# Patient Record
Sex: Male | Born: 1960 | Race: White | Hispanic: No | Marital: Married | Smoking: Former smoker
Health system: Southern US, Community
[De-identification: ages and names within clinical notes are randomized; demographics above are authoritative.]

## PROBLEM LIST (undated history)

## (undated) DIAGNOSIS — I10 Essential (primary) hypertension: Secondary | ICD-10-CM

## (undated) DIAGNOSIS — R55 Syncope and collapse: Secondary | ICD-10-CM

## (undated) DIAGNOSIS — N189 Chronic kidney disease, unspecified: Secondary | ICD-10-CM

## (undated) DIAGNOSIS — E785 Hyperlipidemia, unspecified: Secondary | ICD-10-CM

## (undated) DIAGNOSIS — L723 Sebaceous cyst: Secondary | ICD-10-CM

## (undated) DIAGNOSIS — F419 Anxiety disorder, unspecified: Secondary | ICD-10-CM

## (undated) HISTORY — DX: Sebaceous cyst: L72.3

## (undated) HISTORY — DX: Anxiety disorder, unspecified: F41.9

## (undated) HISTORY — DX: Chronic kidney disease, unspecified: N18.9

## (undated) HISTORY — DX: Syncope and collapse: R55

## (undated) HISTORY — DX: Essential (primary) hypertension: I10

## (undated) HISTORY — PX: NO PAST SURGERIES: SHX2092

## (undated) HISTORY — DX: Hyperlipidemia, unspecified: E78.5

---

## 2005-01-01 ENCOUNTER — Ambulatory Visit: Payer: Self-pay | Admitting: Internal Medicine

## 2005-01-23 ENCOUNTER — Ambulatory Visit: Payer: Self-pay | Admitting: Internal Medicine

## 2006-05-26 ENCOUNTER — Ambulatory Visit: Payer: Self-pay | Admitting: Internal Medicine

## 2006-05-27 ENCOUNTER — Ambulatory Visit: Payer: Self-pay | Admitting: Internal Medicine

## 2006-06-03 ENCOUNTER — Ambulatory Visit: Payer: Self-pay | Admitting: Cardiology

## 2007-05-26 IMAGING — CT CT ABDOMEN W/O CM
2 of 4 series · 17 of 46 positions shown, 19 images · IV contrast (agent unspecified)
Comparison: None.

CLINICAL DATA: Right flank pain, hematuria.
 ABDOMEN CT WITHOUT CONTRAST:
TECHNIQUE: Multidetector CT imaging of the abdomen was performed following the standard protocol without IV contrast.
TECHNIQUE: Multidetector CT imaging of the pelvis was performed following the standard protocol without IV contrast.

[Series 2: abd_pel_w/o 5.0 b30f st · axial · 0.66mm/px · z∈[-424,-10]mm · 14 of 91 slices shown, 16 images]
[im 4/91  soft-tissue]
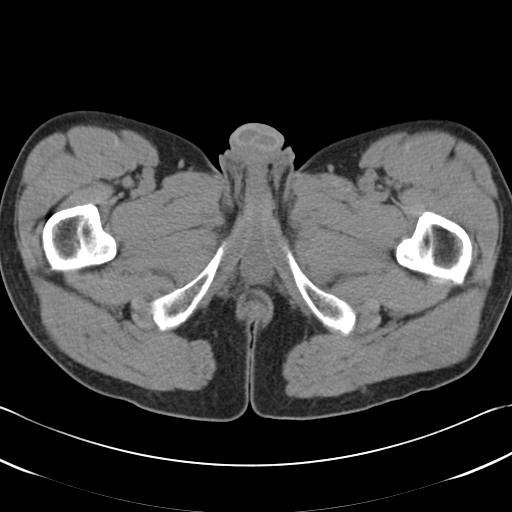
[im 4/91  bone]
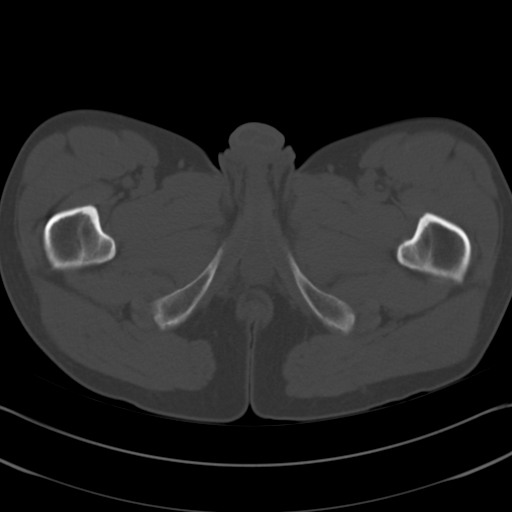
[im 12/91  soft-tissue]
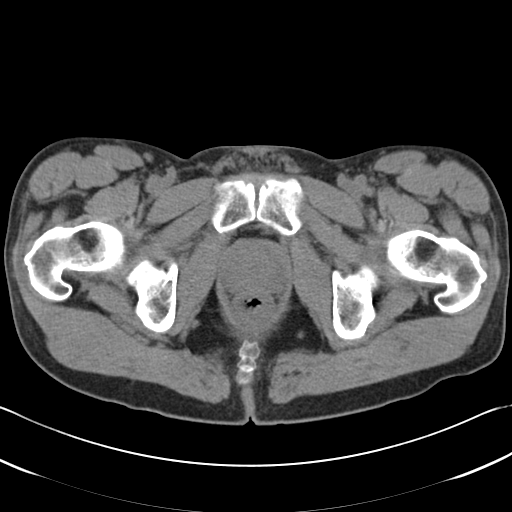
[im 19/91  soft-tissue]
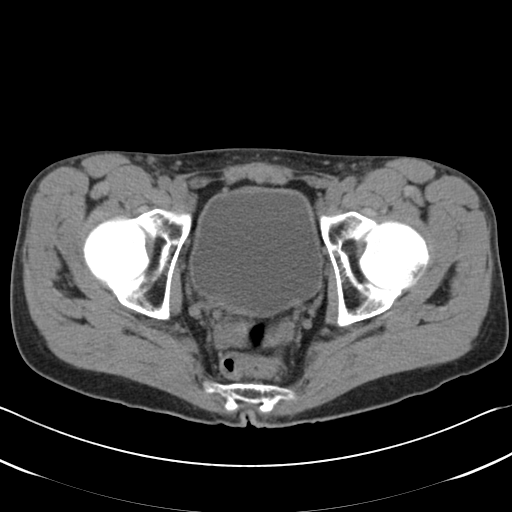
[im 23/91  soft-tissue]
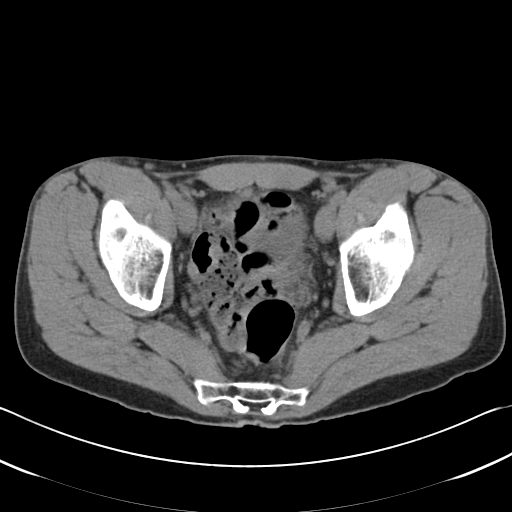
[im 31/91  soft-tissue]
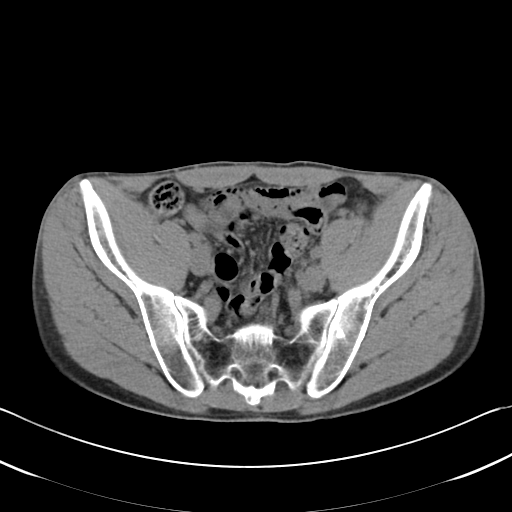
[im 38/91  soft-tissue]
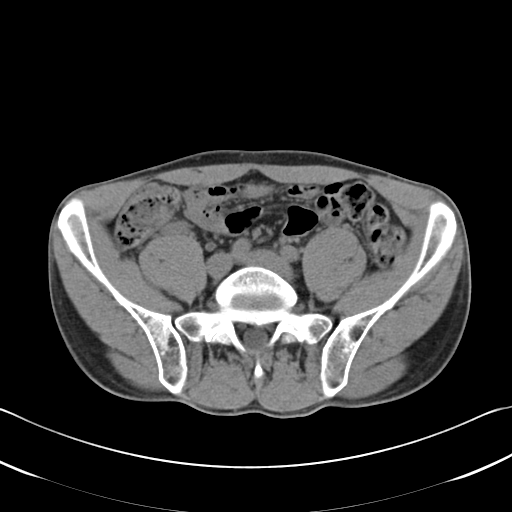
[im 42/91  soft-tissue]
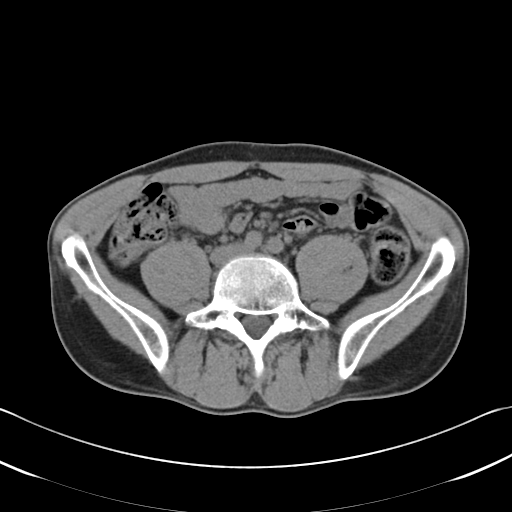
[im 49/91  soft-tissue]
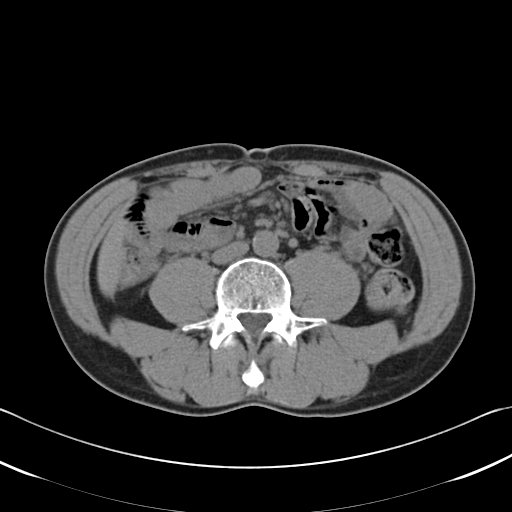
[im 53/91  soft-tissue]
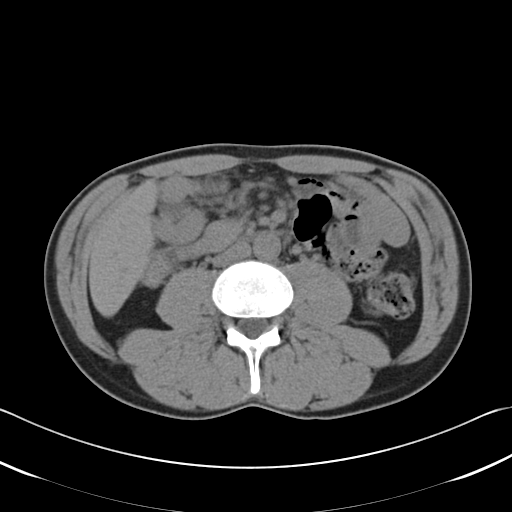
[im 53/91  bone]
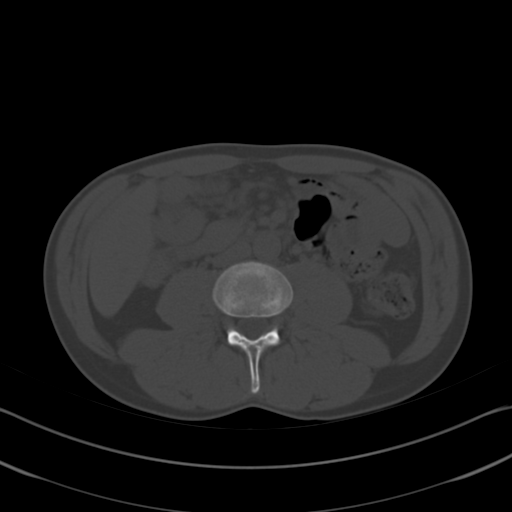
[im 61/91  soft-tissue]
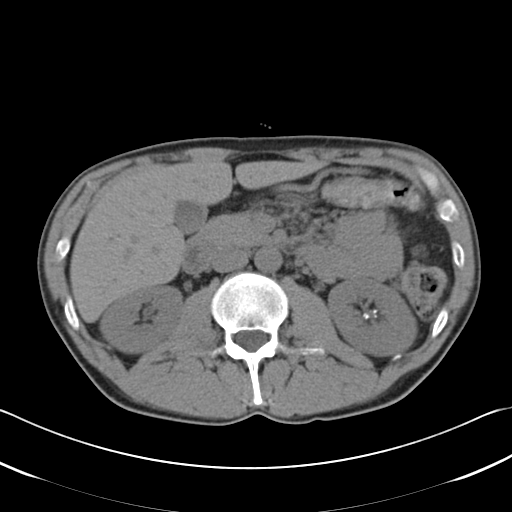
[im 68/91  soft-tissue]
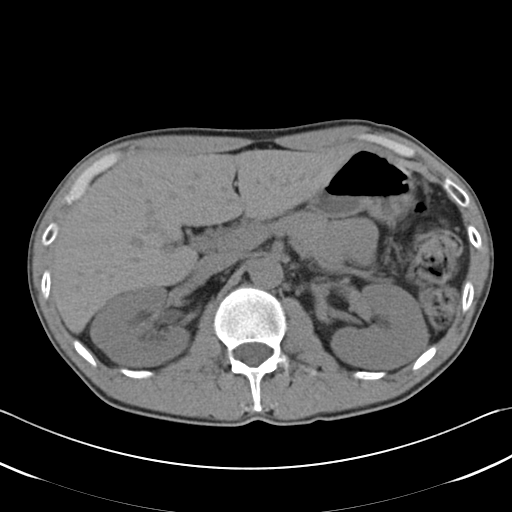
[im 72/91  soft-tissue]
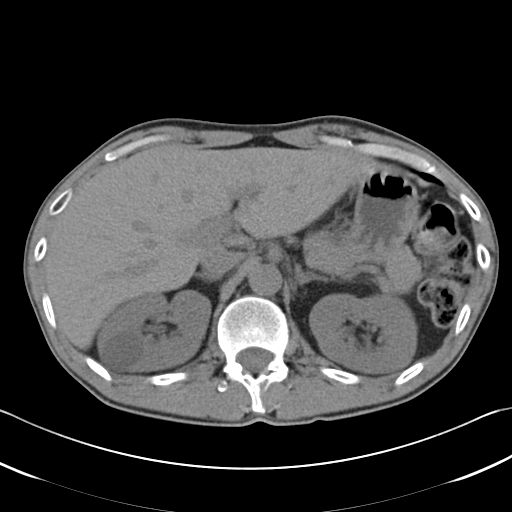
[im 79/91  soft-tissue]
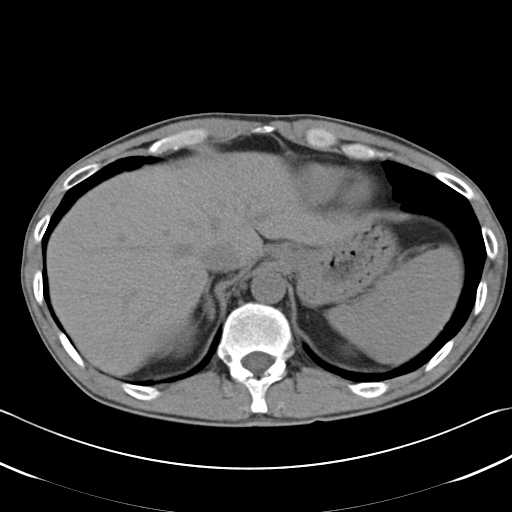
[im 87/91  soft-tissue]
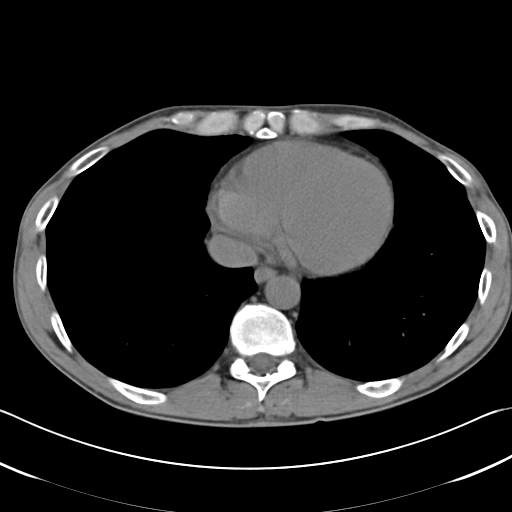

[Series 602: <mpr range> · coronal · 0.92mm/px · 3 of 63 slices shown]
[im 21/63  soft-tissue]
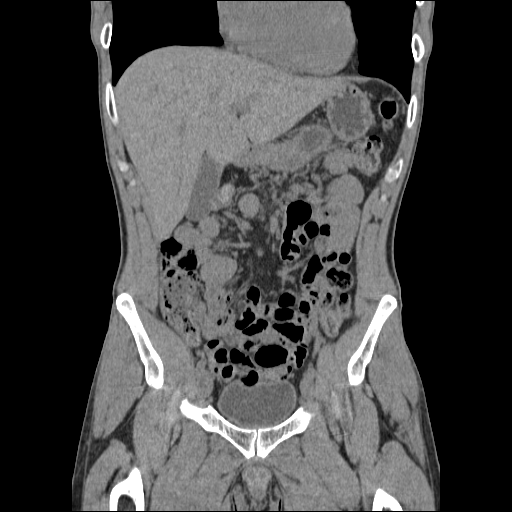
[im 28/63  soft-tissue]
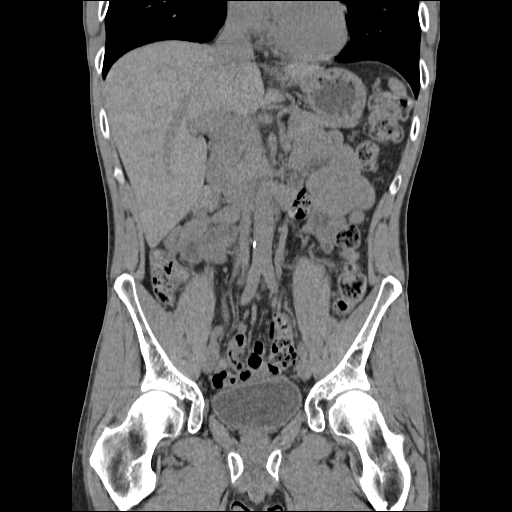
[im 35/63  soft-tissue]
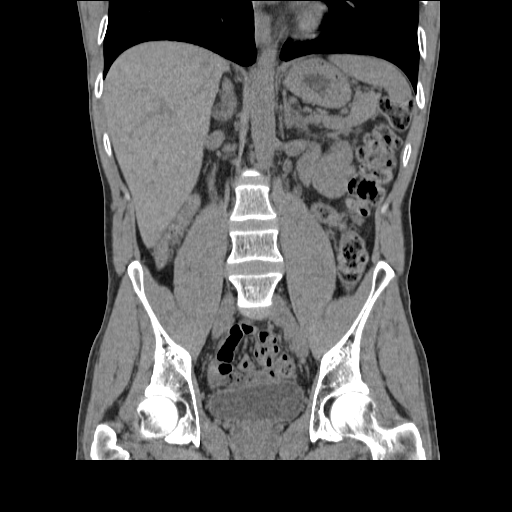

[17 of 46 positions shown; findings below may reference images not displayed]

FINDINGS: Liver measures 19 cm.  Gallbladder, adrenal glands unremarkable.  A 3 cm low attenuation lesion in the right kidney is likely a cyst.  Right ureter is minimally dilated.  There are small nonobstructing stones in the lower pole of the left kidney.  Spleen, pancreas, stomach, and small bowel are unremarkable.  There are small mesenteric and retroperitoneal lymph nodes.
IMPRESSION: 1.  Mild right hydronephrosis.  
 2.  Hepatomegaly. 
 PELVIS CT WITHOUT CONTRAST:
FINDINGS: A 4 mm stone is seen at the right ureterovesical junction.  Distal right ureter is mildly dilated.  Colon unremarkable.  No pathologically enlarged lymph nodes.  No free fluid.  No worrisome lytic or sclerotic lesion.
IMPRESSION: 4 mm right UVJ stone with mild dilatation of the right ureter.

## 2007-05-28 ENCOUNTER — Telehealth: Payer: Self-pay | Admitting: Internal Medicine

## 2007-05-30 DIAGNOSIS — R55 Syncope and collapse: Secondary | ICD-10-CM

## 2007-05-30 HISTORY — DX: Syncope and collapse: R55

## 2007-06-01 ENCOUNTER — Ambulatory Visit: Payer: Self-pay | Admitting: Internal Medicine

## 2007-06-01 DIAGNOSIS — R55 Syncope and collapse: Secondary | ICD-10-CM | POA: Insufficient documentation

## 2007-06-01 LAB — CONVERTED CEMR LAB: Glucose, Bld: 92 mg/dL

## 2007-06-07 ENCOUNTER — Telehealth: Payer: Self-pay | Admitting: Internal Medicine

## 2007-06-08 LAB — CONVERTED CEMR LAB
Alkaline Phosphatase: 46 units/L (ref 39–117)
BUN: 7 mg/dL (ref 6–23)
Basophils Relative: 1.3 % — ABNORMAL HIGH (ref 0.0–1.0)
Bilirubin, Direct: 0.1 mg/dL (ref 0.0–0.3)
CO2: 31 meq/L (ref 19–32)
Eosinophils Absolute: 0.2 10*3/uL (ref 0.0–0.6)
GFR calc Af Amer: 118 mL/min
GFR calc non Af Amer: 97 mL/min
Hemoglobin: 14.1 g/dL (ref 13.0–17.0)
Lymphocytes Relative: 43.7 % (ref 12.0–46.0)
MCHC: 33.8 g/dL (ref 30.0–36.0)
MCV: 99.4 fL (ref 78.0–100.0)
Monocytes Absolute: 0.6 10*3/uL (ref 0.2–0.7)
Monocytes Relative: 10.8 % (ref 3.0–11.0)
Neutro Abs: 2.3 10*3/uL (ref 1.4–7.7)
Potassium: 4.2 meq/L (ref 3.5–5.1)
Total Protein: 6.9 g/dL (ref 6.0–8.3)

## 2009-08-10 ENCOUNTER — Ambulatory Visit: Payer: Self-pay | Admitting: Internal Medicine

## 2009-08-10 DIAGNOSIS — L723 Sebaceous cyst: Secondary | ICD-10-CM

## 2009-08-14 LAB — CONVERTED CEMR LAB
AST: 12 units/L (ref 0–37)
Albumin: 4.9 g/dL (ref 3.5–5.2)
CO2: 25 meq/L (ref 19–32)
Chloride: 103 meq/L (ref 96–112)
HDL: 65 mg/dL (ref >39)
LDL Cholesterol: 122 mg/dL — ABNORMAL HIGH (ref 0–99)
Lymphs Abs: 2.3 10*3/uL (ref 0.7–4.0)
MCV: 91.4 fL (ref 78.0–100.0)
Monocytes Relative: 11 % (ref 3–12)
Neutro Abs: 2.3 10*3/uL (ref 1.7–7.7)
Neutrophils Relative %: 43 % (ref 43–77)
RBC: 4.51 M/uL (ref 4.22–5.81)
Sodium: 140 meq/L (ref 135–145)
TSH: 0.965 microintl units/mL (ref 0.350–4.500)
Total Bilirubin: 0.8 mg/dL (ref 0.3–1.2)
Total CHOL/HDL Ratio: 3.2
WBC: 5.4 10*3/uL (ref 4.0–10.5)

## 2010-01-28 ENCOUNTER — Ambulatory Visit: Payer: Self-pay | Admitting: Internal Medicine

## 2010-01-28 DIAGNOSIS — J069 Acute upper respiratory infection, unspecified: Secondary | ICD-10-CM | POA: Insufficient documentation

## 2010-02-04 ENCOUNTER — Telehealth: Payer: Self-pay | Admitting: Internal Medicine

## 2010-04-30 NOTE — Assessment & Plan Note (Signed)
Summary: congested/cbs   Vital Signs:  Patient profile:   50 year old male Weight:      169.38 pounds O2 Sat:      98 % on Room air Temp:     98.1 degrees F oral Pulse rate:   65 / minute Pulse rhythm:   regular BP sitting:   134 / 84  (left arm) Cuff size:   large  Vitals Entered By: Army Fossa CMA (January 28, 2010 2:28 PM)  O2 Flow:  Room air CC: Pt here c/o head congstion Comments Sinus pressure x a few weeks (L) ear bothering him pharm- [facility] [address]   History of Present Illness: sinus congestion for 2 weeks, some postnasal dripping, has been taking over-the-counter decongestants without much help and using a neti pot   Current Medications (verified): 1)  None  Allergies (verified): No Known Drug Allergies  Past History:  Past Medical History: Reviewed history from 08/10/2009 and no changes required. urolithiasis, CT 3-08.... 4 mm right UVJ stone with mild dilatation of the right ureter  syncope 3-09: refused w/u , evaluation; no further episodes   Past Surgical History: Reviewed history from 06/01/2007 and no changes required. no  Social History: Reviewed history from 08/10/2009 and no changes required. 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 Eyes:  slightly itchy eyes, no itchy nose or. ENT:  some PN drip. Resp:  mild cough no chest congestion. GI:  Denies nausea and vomiting. MS:  Denies muscle aches.  Physical Exam  General:  alert, well-developed, and well-nourished.   Head:  face symmetric, nontender to palpation Ears:  R ear normal and L ear normal.   Nose:  slightly congested, left turbinates enlarged Mouth:  no redness or discharge Lungs:  normal respiratory effort, no intercostal retractions, no accessory muscle use, and normal breath sounds.     Impression & Recommendations:  Problem #  1:  URI (ICD-465.9) URI with early sinusitis? see instructions   Complete Medication List: 1)  Amoxicillin 500 Mg Tabs (Amoxicillin) .... 2 by mouth two times a day 2)  Prednisone 10 Mg Tabs (Prednisone) .... 3 by mouth once daily x 2, 2x2,1x2  Patient Instructions: 1)  rest--fluids 2)  robitussin 2 tsp every 8 hours x 3 or 4 days 3)  amoxicillin x 1 week 4)  prednisone as prescribed 5)  call if no better in few days  6)  netti  pot as needed  Prescriptions: PREDNISONE 10 MG TABS (PREDNISONE) 3 by mouth once daily x 2, 2x2,1x2  #12 x 0   Entered and Authorized by:   Elita Quick E. Vihana Kydd MD   Signed by:   Nolon Rod. Darivs Lunden MD on 01/28/2010   Method used:   Print then Give to Patient   RxID:   [id number] AMOXICILLIN 500 MG TABS (AMOXICILLIN) 2 by mouth two times a day  #28 x 0   Entered and Authorized by:   Nolon Rod. Durant Scibilia MD   Signed by:   Nolon Rod. Amonie Wisser MD on 01/28/2010   Method used:   Print then Give to Patient   RxID:   [id number]    Orders Added: 1)  Est. Patient Level III [64403]

## 2010-04-30 NOTE — Progress Notes (Signed)
Summary: not feeling any better  Phone Note Call from Patient Call back at Work Phone [phone]   Caller: Patient Summary of Call: Dr Drue Novel saw patient last Monday 10/31--says he has finished all his meds, but he doesnt feel like symptoms have improved at all---does he need another appointment or more medication??  patient used [facility]---[address] Initial call taken by: Jerolyn Shin,  February 04, 2010 9:46 AM  Follow-up for Phone Call        Please advise. Lucious Groves CMA  February 04, 2010 10:54 AM   Additional Follow-up for Phone Call Additional follow up Details #1::        call  5 additional days of amoxicillin 500 mg 2 p.o. b.i.d.. Call if not improved in few  days Additional Follow-up by: [facility] E. Paz MD,  February 04, 2010 1:34 PM    Additional Follow-up for Phone Call Additional follow up Details #2::    Patient notified. Follow-up by: Lucious Groves CMA,  February 04, 2010 2:07 PM  Prescriptions: AMOXICILLIN 500 MG TABS (AMOXICILLIN) 2 by mouth two times a day  #10 x 0   Entered by:   Lucious Groves CMA   Authorized by:   Nolon Rod. Paz MD   Signed by:   Lucious Groves CMA on 02/04/2010   Method used:   Electronically to        [id number] (retail)       [address]       [place], [place]  [address]       Ph: [phone]       Fax: [phone]   RxID:   [id number]

## 2010-04-30 NOTE — Assessment & Plan Note (Signed)
Summary: CPX/KDC   Vital Signs:  Patient profile:   50 year old male Height:      71 inches Weight:      165.2 pounds BMI:     23.12 Pulse rate:   60 / minute BP sitting:   124 / 70  Vitals Entered By: Shary Decamp (Aug 10, 2009 2:49 PM) CC: cpx - fasting, Back Pain Is Patient Diabetic? No   History of Present Illness: CPX feels well    Preventive Screening-Counseling & Management  Alcohol-Tobacco     Alcohol type: beer, drinks 2x/week, 12/pk     Smoking Status: quit     Year Quit: 2009  Caffeine-Diet-Exercise     Caffeine use/day: 4     Does Patient Exercise: no      Drug Use:  no.    Current Medications (verified): 1)  None  Allergies (verified): No Known Drug Allergies  Past History:  Past Medical History: urolithiasis, CT 3-08.... 4 mm right UVJ stone with mild dilatation of the right ureter  syncope 3-09: refused w/u , evaluation; no further episodes   Past Surgical History: Reviewed history from 06/01/2007 and no changes required. no  Family History: Reviewed history from 06/01/2007 and no changes required. MI--father at mid 9s DM--no colon ca--no prostate ca--no  Social History: Reviewed history from 06/01/2007 and no changes required. 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 CV:  Denies chest pain or discomfort, palpitations, and swelling of feet. Resp:  Denies cough and shortness of breath. GI:  Denies bloody stools, nausea, and vomiting. GU:  Denies dysuria and hematuria; no back pain. Psych:  Denies anxiety and depression; sleep is irregular all his life .  Physical Exam  General:  alert, well-developed, and well-nourished.   Neck:  no masses, no thyromegaly, and normal carotid upstroke.    Lungs:  normal respiratory effort, no intercostal retractions, no accessory muscle use, and normal breath sounds.   Heart:  normal rate, regular rhythm, and no murmur.   Abdomen:  soft, non-tender, no distention, no masses, no guarding, and no rigidity.   Extremities:  no pretibial edema bilaterally  Neurologic:  alert & oriented X3, strength normal in all extremities, and gait normal.   Psych:  Oriented X3, memory intact for recent and remote, normally interactive, good eye contact, not anxious appearing, and not depressed appearing.     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    Preventive Care Screening  Last Tetanus Booster:    Date:  03/31/2004    Results:  Td  Prior Values:    PSA:  0.62 (05/27/2006)  Risk Factors:  Tobacco use:  quit    Year quit:  2009 Drug use:  no Caffeine use:  4 drinks per day Alcohol use:  yes    Type:  beer, drinks 2x/week, 12/pk Exercise:  no

## 2011-03-17 ENCOUNTER — Encounter: Payer: Self-pay | Admitting: *Deleted

## 2011-03-17 ENCOUNTER — Emergency Department (HOSPITAL_BASED_OUTPATIENT_CLINIC_OR_DEPARTMENT_OTHER)
Admission: EM | Admit: 2011-03-17 | Discharge: 2011-03-17 | Disposition: A | Discharge status: Home / Self Care | Payer: PRIVATE HEALTH INSURANCE | Attending: Emergency Medicine | Admitting: Emergency Medicine

## 2011-03-17 ENCOUNTER — Emergency Department (INDEPENDENT_AMBULATORY_CARE_PROVIDER_SITE_OTHER): Payer: PRIVATE HEALTH INSURANCE

## 2011-03-17 ENCOUNTER — Other Ambulatory Visit: Payer: Self-pay

## 2011-03-17 DIAGNOSIS — R0789 Other chest pain: Secondary | ICD-10-CM

## 2011-03-17 DIAGNOSIS — R079 Chest pain, unspecified: Secondary | ICD-10-CM | POA: Insufficient documentation

## 2011-03-17 LAB — BASIC METABOLIC PANEL
BUN: 8 mg/dL (ref 6–23)
Creatinine, Ser: 0.7 mg/dL (ref 0.50–1.35)
GFR calc non Af Amer: >90 mL/min (ref >90)
Glucose, Bld: 90 mg/dL (ref 70–99)
Potassium: 3.8 mEq/L (ref 3.5–5.1)

## 2011-03-17 LAB — CBC
HCT: 41 % (ref 39.0–52.0)
Hemoglobin: 14.6 g/dL (ref 13.0–17.0)
MCHC: 35.6 g/dL (ref 30.0–36.0)
MCV: 91.7 fL (ref 78.0–100.0)

## 2011-03-17 LAB — CARDIAC PANEL(CRET KIN+CKTOT+MB+TROPI)
Relative Index: INVALID (ref 0.0–2.5)
Troponin I: <0.3 ng/mL (ref <0.30)

## 2011-03-17 MED ORDER — ASPIRIN 81 MG PO CHEW
324.0000 mg | CHEWABLE_TABLET | Freq: Once | ORAL | Status: AC
  Administered 2011-03-17: 324 mg via ORAL
  Filled 2011-03-17: qty 4

## 2011-03-17 NOTE — ED Notes (Signed)
Call placed to [facility] Cardiologist via carelink

## 2011-03-17 NOTE — ED Provider Notes (Signed)
Medical screening examination/treatment/procedure(s) were conducted as a shared visit with non-physician practitioner(s) and myself.  I personally evaluated the patient during the encounter  Concerning for unstable angina with strong family hx and intermittent left shoulder "tightness" that is worsening to the point that it brought him to the ER today. I spoke with Dr Dietrich Pates, cardiology who agreed to admission. However, after this was completed, the pt decided he would prefer to complete his evaluation as an outpatient. He agrees to leave AMA. He understands the risks which include death and these were laid out to the patient in plain terms with his wife present. Advised to return for worsening symptoms or if he changes his mind. I personally called and obtained the earliest appointment without [facility]. He is educated and of sound mind and has the capacity to make his own decisions. His wife was present for all of this  1. Chest pain    Dg Chest 2 View  03/17/2011  *RADIOLOGY REPORT*  Clinical Data: Chest pain  CHEST - 2 VIEW  Comparison: None.  Findings: Cardiomediastinal silhouette is unremarkable.  No acute infiltrate or pleural effusion.  No pulmonary edema.  Bony thorax is unremarkable.  IMPRESSION: No active disease.  Original Report Authenticated By: Natasha Mead, M.D.   I personally evaluated the patients cxr  Results for orders placed during the hospital encounter of 03/17/11  CBC      Component Value Range   WBC 5.7  4.0 - 10.5 (K/uL)   RBC 4.47  4.22 - 5.81 (MIL/uL)   Hemoglobin 14.6  13.0 - 17.0 (g/dL)   HCT 60.4  54.0 - 98.1 (%)   MCV 91.7  78.0 - 100.0 (fL)   MCH 32.7  26.0 - 34.0 (pg)   MCHC 35.6  30.0 - 36.0 (g/dL)   RDW 19.1  47.8 - 29.5 (%)   Platelets 250  150 - 400 (K/uL)  BASIC METABOLIC PANEL      Component Value Range   Sodium 138  135 - 145 (mEq/L)   Potassium 3.8  3.5 - 5.1 (mEq/L)   Chloride 101  96 - 112 (mEq/L)   CO2 25  19 - 32 (mEq/L)   Glucose,  Bld 90  70 - 99 (mg/dL)   BUN 8  6 - 23 (mg/dL)   Creatinine, Ser 6.21  0.50 - 1.35 (mg/dL)   Calcium 9.4  8.4 - 30.8 (mg/dL)   GFR calc non Af Amer >90  >90 (mL/min)   GFR calc Af Amer >90  >90 (mL/min)  CARDIAC PANEL(CRET KIN+CKTOT+MB+TROPI)      Component Value Range   Total CK 81  7 - 232 (U/L)   CK, MB 1.7  0.3 - 4.0 (ng/mL)   Troponin I <0.30  <0.30 (ng/mL)   Relative Index RELATIVE INDEX IS INVALID  0.0 - 2.5   TROPONIN I      Component Value Range   Troponin I <0.30  <0.30 (ng/mL)     Lyanne Co, MD 03/17/11 2217

## 2011-03-17 NOTE — ED Provider Notes (Signed)
History     CSN: [id number] Arrival date & time: 03/17/2011 11:45 AM   First MD Initiated Contact with Patient 03/17/11 1145      No chief complaint on file.   (Consider location/radiation/quality/duration/timing/severity/associated sxs/prior treatment) HPI Comments: Patients with onset of left lateral chest "tightness" and a numb sensation in his left hand that started 2 hours prior to arrival. Patient states that he has been having several episodes of the past 2-3 weeks of similar symptoms however it was worse today. Tightness is unassociated with activity or time of day. It is not worsened with movement or palpation. The patient denies new injury. The tightness does not radiate. It is not associated with diaphoresis, palpitations, nausea or vomiting, lightheadedness. The patient denies any shortness of breath, lower extremity edema, recent immobilization, past history of blood clots. The patient is a former smoker and quit 3 years ago. He denies a history of hyperlipidemia, hypertension, or diabetes. His family history is significant for his father who had a heart attack at age 77. No treatments or aspirin prior to arrival.  Patient is a 50 y.o. male presenting with chest pain. The history is provided by the patient.  Chest Pain The chest pain began 1 - 2 hours ago. Duration of episode(s) is 2 hours. Chest pain occurs constantly. The chest pain is unchanged. The quality of the pain is described as tightness. The pain does not radiate. Pertinent negatives for primary symptoms include no fever, no shortness of breath, no cough, no wheezing, no palpitations, no abdominal pain, no nausea and no vomiting.  Pertinent negatives for associated symptoms include no lower extremity edema and no weakness. He tried nothing for the symptoms. Risk factors include smoking/tobacco exposure.  Pertinent negatives for past medical history include no CAD, no diabetes, no hyperlipidemia and no hypertension.  His  family medical history is significant for early MI in family.  Procedure history is negative for cardiac catheterization.     No past medical history on file.  No past surgical history on file.  No family history on file.  History  Substance Use Topics  . Smoking status: Not on file  . Smokeless tobacco: Not on file  . Alcohol Use: Not on file      Review of Systems  Constitutional: Negative for fever.  HENT: Negative for neck pain.   Eyes: Negative for redness.  Respiratory: Negative for cough, shortness of breath and wheezing.   Cardiovascular: Positive for chest pain. Negative for palpitations.  Gastrointestinal: Negative for nausea, vomiting and abdominal pain.  Genitourinary: Negative for dysuria.  Musculoskeletal: Negative for back pain.  Skin: Negative for rash.  Neurological: Negative for syncope, weakness and light-headedness.    Allergies  Review of patient's allergies indicates not on file.  Home Medications  No current outpatient prescriptions on file.  BP 155/101  Pulse 102  Temp(Src) 98.1 F (36.7 C) (Oral)  Resp 18  Ht 5\' 11"  (1.803 m)  Wt 150 lb (68.04 kg)  BMI 20.92 kg/m2  SpO2 100%  Physical Exam  Nursing note and vitals reviewed. Constitutional: He is oriented to person, place, and time. He appears well-developed and well-nourished.  HENT:  Head: Normocephalic and atraumatic.  Eyes: Conjunctivae are normal. Right eye exhibits no discharge. Left eye exhibits no discharge.  Neck: Normal range of motion. Neck supple.  Cardiovascular: Normal rate, regular rhythm, normal heart sounds and intact distal pulses.   No murmur heard. Pulmonary/Chest: Effort normal and breath sounds normal. No respiratory distress.  He has no wheezes. He exhibits no tenderness.  Abdominal: Soft. Bowel sounds are normal. There is no tenderness. There is no rebound and no guarding.  Musculoskeletal: He exhibits no edema and no tenderness.       No shoulder pain or  chest pain with palpation or movement. Patient has full range of motion and 5 out of 5 strength in left upper extremity.  Neurological: He is alert and oriented to person, place, and time.  Skin: Skin is warm and dry.  Psychiatric: He has a normal mood and affect.    ED Course  Procedures (including critical care time)   Labs Reviewed  CBC  BASIC METABOLIC PANEL  CARDIAC PANEL(CRET KIN+CKTOT+MB+TROPI)  TROPONIN I   Dg Chest 2 View  03/17/2011  *RADIOLOGY REPORT*  Clinical Data: Chest pain  CHEST - 2 VIEW  Comparison: None.  Findings: Cardiomediastinal silhouette is unremarkable.  No acute infiltrate or pleural effusion.  No pulmonary edema.  Bony thorax is unremarkable.  IMPRESSION: No active disease.  Original Report Authenticated By: Natasha Mead, M.D.     1. Chest pain     12:00 PM Patient seen and examined. Aspirin ordered. Workup initiated. EKG reviewed and is normal.   Date: 03/17/2011  Rate: 103  Rhythm: sinus tachycardia  QRS Axis: normal  Intervals: normal  ST/T Wave abnormalities: normal  Conduction Disutrbances:none  Narrative Interpretation:   Old EKG Reviewed: none available  2:08 PM Dr. Patria Mane has seen patient. Will admit to [facility] for chest pain workup given risk factors.  2:42 PM patient wishes to leave AMA. Appointment scheduled with Dr. Eden Emms on 04/08/2011 at 1:45pm. Pt informed.      MDM  Admitted to [facility] for chest pain.  2:42 PM patient to sign out AGAINST MEDICAL ADVICE.    Carolee Rota, PA 03/17/11 1409  Carolee Rota, [place] 03/19/11 [phone]

## 2011-03-17 NOTE — ED Notes (Signed)
Call place to [facility] cardiologist via carelink for Dr. Patria Mane

## 2011-03-17 NOTE — ED Notes (Signed)
Pt to room 4 in w/c, able to stand and take steps to bed. Pt is a/a/ox4 in nad, pt reports sudden onset of left chest/shoulder pain, while working at his computer, with left arm numbness. Pt states all tightness and numbness have resolved.

## 2011-03-17 NOTE — ED Notes (Signed)
Repeat ECG done per VO from [facility] and given to him for confirmation. Patient has no additional needs at this time

## 2011-03-18 ENCOUNTER — Ambulatory Visit (INDEPENDENT_AMBULATORY_CARE_PROVIDER_SITE_OTHER): Payer: PRIVATE HEALTH INSURANCE | Admitting: Internal Medicine

## 2011-03-18 VITALS — BP 118/72 | HR 83 | Temp 98.1°F | Ht 71.0 in | Wt 166.0 lb

## 2011-03-18 DIAGNOSIS — R079 Chest pain, unspecified: Secondary | ICD-10-CM | POA: Insufficient documentation

## 2011-03-18 DIAGNOSIS — F411 Generalized anxiety disorder: Secondary | ICD-10-CM

## 2011-03-18 DIAGNOSIS — F419 Anxiety disorder, unspecified: Secondary | ICD-10-CM

## 2011-03-18 LAB — AST: AST: 17 U/L (ref 0–37)

## 2011-03-18 LAB — ALT: ALT: 15 U/L (ref 0–53)

## 2011-03-18 LAB — LIPID PANEL: HDL: 78.5 mg/dL (ref >39.00)

## 2011-03-18 MED ORDER — CITALOPRAM HYDROBROMIDE 20 MG PO TABS: 20.0000 mg | ORAL_TABLET | Freq: Every day | ORAL | Status: DC

## 2011-03-18 NOTE — Assessment & Plan Note (Signed)
Long h/o anxiety, worse lately Treatment options discussed: counseling, SSRI, benzos In the past he used zoloft, does not recall how he did. We agreed to try SSRIs, given insomnia, citalopram at bedtime is a good choice Plan; Citalopram RTC 1 month for CPX

## 2011-03-18 NOTE — Progress Notes (Signed)
  Subjective:    Patient ID: Matthew Bautista, male    DOB: December 19, 1960, 50 y.o.   MRN: [id number]  HPI Acute visit, here for the management of anxiety. Reports a lot of stress in his life, related mostly to having a new house and family members visiting him this Christmas. In  the past he did have anxiety, at some point he took Zoloft, this is however the first time sx are severe. Yesterday, he got acutely anxious, developed a "pulling in the chest" and left arm numbness. States this is not the first time he has such symptoms and they are usually related to anxiety. Records from the ER reviewed, EKG normal, BMP, CKs x2, CBC normal. Chest x-ray normal. He left AMA, decided not to be admitted to cardiology.  Past Medical History: urolithiasis, CT 3-08.... 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 He is asleep is not the best but is not worse than usual. Since he left the ER, no further chest symptoms    Objective:   Physical Exam  Constitutional: He is oriented to person, place, and time. He appears well-developed and well-nourished.  HENT:  Head: Normocephalic and atraumatic.  Cardiovascular: Normal rate, regular rhythm and normal heart sounds.   No murmur heard. Pulmonary/Chest: Effort normal and breath sounds normal. No respiratory distress. He has no wheezes. He has no rales.  Musculoskeletal: He exhibits no edema.  Neurological: He is alert and oriented to person, place, and time.  Psychiatric: He has a normal mood and affect. His behavior is  normal. Judgment and thought content normal.       Assessment & Plan:

## 2011-03-18 NOTE — Assessment & Plan Note (Addendum)
S/p evaluation for CP @ ER yesterday, left AMA, declined admission He already has an outpt evaluation schedule w/  Cards rec  ASA ER if sx resurface  Check a cholesterol panel

## 2011-03-18 NOTE — Patient Instructions (Signed)
Citalopram 20 mg : 1/2 tablet a day x 1 week, then 1 tablet a day. Take it in the afternoon

## 2011-03-19 ENCOUNTER — Encounter: Payer: Self-pay | Admitting: Internal Medicine

## 2011-03-19 NOTE — ED Provider Notes (Signed)
Medical screening examination/treatment/procedure(s) were conducted as a shared visit with non-physician practitioner(s) and myself.  I personally evaluated the patient during the encounter   Lyanne Co, MD 03/19/11 1705

## 2011-03-26 ENCOUNTER — Other Ambulatory Visit: Payer: Self-pay | Admitting: *Deleted

## 2011-03-26 ENCOUNTER — Encounter: Payer: Self-pay | Admitting: *Deleted

## 2011-03-26 MED ORDER — ATORVASTATIN CALCIUM 10 MG PO TABS: 10.0000 mg | ORAL_TABLET | Freq: Every day | ORAL | Status: DC

## 2011-04-04 ENCOUNTER — Encounter: Payer: Self-pay | Admitting: *Deleted

## 2011-04-08 ENCOUNTER — Encounter: Payer: Self-pay | Admitting: Cardiovascular Disease

## 2011-04-08 ENCOUNTER — Ambulatory Visit (INDEPENDENT_AMBULATORY_CARE_PROVIDER_SITE_OTHER): Payer: PRIVATE HEALTH INSURANCE | Admitting: Cardiovascular Disease

## 2011-04-08 DIAGNOSIS — E782 Mixed hyperlipidemia: Secondary | ICD-10-CM

## 2011-04-08 DIAGNOSIS — F419 Anxiety disorder, unspecified: Secondary | ICD-10-CM

## 2011-04-08 DIAGNOSIS — R079 Chest pain, unspecified: Secondary | ICD-10-CM

## 2011-04-08 DIAGNOSIS — F411 Generalized anxiety disorder: Secondary | ICD-10-CM

## 2011-04-08 NOTE — Assessment & Plan Note (Signed)
Continue celexa Has been on highter dose in past  F/U [facility]

## 2011-04-08 NOTE — Progress Notes (Signed)
51 yo referred by Dr Drue Novel for SSCP.  Seen in ER in December.  R/O normal ECG.  Some stress and anxiety around the holidays.  Just moved into a new house and had family in.  Since then has been on celexa and statin.  :Pain atypical and nonexertional.  Feels like seat belt around left shoulder.  Has not felt as much since ER.  No previous shoulder,neck or back pathology.  Pain centered in shoulder and left arm.  CRF;s elevated cholesterol and family history.  No dyspnea, palpitations or syncope.  Concerned about taking statins long term.  Has never had a ETT  ROS: Denies fever, malais, weight loss, blurry vision, decreased visual acuity, cough, sputum, SOB, hemoptysis, pleuritic pain, palpitaitons, heartburn, abdominal pain, melena, lower extremity edema, claudication, or rash.  All other systems reviewed and negative   General: Affect appropriate Healthy:  appears stated age HEENT: normal Neck supple with no adenopathy JVP normal no bruits no thyromegaly Lungs clear with no wheezing and good diaphragmatic motion Heart:  S1/S2 no murmur,rub, gallop or click PMI normal Abdomen: benighn, BS positve, no tenderness, no AAA no bruit.  No HSM or HJR Distal pulses intact with no bruits No edema Neuro non-focal Skin warm and dry No muscular weakness  Medications Current Outpatient Prescriptions  Medication Sig Dispense Refill  . aspirin 81 MG tablet Take 81 mg by mouth daily.        Marland Kitchen atorvastatin (LIPITOR) 10 MG tablet Take 1 tablet (10 mg total) by mouth at bedtime.  30 tablet  3  . citalopram (CELEXA) 20 MG tablet Take 1 tablet (20 mg total) by mouth daily.  30 tablet  1    Allergies Review of patient's allergies indicates no known allergies.  Family History: No family history on file.  Social History: History   Social History  . Marital Status: Married    Spouse Name: N/A    Number of Children: N/A  . Years of Education: N/A   Occupational History  . Not on file.   Social  History Main Topics  . Smoking status: Former Games developer  . Smokeless tobacco: Not on file  . Alcohol Use: Yes  . Drug Use: No  . Sexually Active:    Other Topics Concern  . Not on file   Social History Narrative  . No narrative on file    Electrocardiogram: 12/12 NSR normal ECG in ER  Assessment and Plan

## 2011-04-08 NOTE — Assessment & Plan Note (Signed)
Cholesterol is at goal.  Continue current dose of statin and diet Rx.  No myalgias or side effects.  F/U  LFT's in 6 months. Lab Results  Component Value Date   LDLCALC 122* 08/10/2009   F/U [facility].  Consider red yeast rice and diet especially if ETT is normal

## 2011-04-08 NOTE — Assessment & Plan Note (Signed)
Atypical normal ECG  F/U ETT  

## 2011-04-08 NOTE — Patient Instructions (Signed)
Your physician recommends that you schedule a follow-up appointment in: AS NEEDED  Your physician recommends that you continue on your current medications as directed. Please refer to the Current Medication list given to you today. Your physician has requested that you have an exercise tolerance test. For further information please visit [email or web address] Please also follow instruction sheet, as given. DX CHEST PAIN  SCHEDULE WITH [facility] OR DR Eden Emms

## 2011-04-22 ENCOUNTER — Encounter: Payer: Self-pay | Admitting: *Deleted

## 2011-04-23 ENCOUNTER — Encounter: Payer: Self-pay | Admitting: Internal Medicine

## 2011-04-23 ENCOUNTER — Ambulatory Visit (INDEPENDENT_AMBULATORY_CARE_PROVIDER_SITE_OTHER): Payer: PRIVATE HEALTH INSURANCE | Admitting: Internal Medicine

## 2011-04-23 VITALS — BP 118/84 | HR 76 | Temp 98.2°F | Resp 14 | Wt 164.4 lb

## 2011-04-23 DIAGNOSIS — E782 Mixed hyperlipidemia: Secondary | ICD-10-CM

## 2011-04-23 DIAGNOSIS — F419 Anxiety disorder, unspecified: Secondary | ICD-10-CM

## 2011-04-23 DIAGNOSIS — F411 Generalized anxiety disorder: Secondary | ICD-10-CM

## 2011-04-23 LAB — LIPID PANEL
HDL: 69.1 mg/dL (ref >39.00)
Triglycerides: 75 mg/dL (ref 0.0–149.0)

## 2011-04-23 LAB — ALT: ALT: 16 U/L (ref 0–53)

## 2011-04-23 LAB — AST: AST: 16 U/L (ref 0–37)

## 2011-04-23 MED ORDER — CITALOPRAM HYDROBROMIDE 20 MG PO TABS: 20.0000 mg | ORAL_TABLET | Freq: Every day | ORAL | Status: DC

## 2011-04-23 MED ORDER — ATORVASTATIN CALCIUM 10 MG PO TABS: 10.0000 mg | ORAL_TABLET | Freq: Every day | ORAL | Status: DC

## 2011-04-23 NOTE — Assessment & Plan Note (Addendum)
Great improvement with citalopram without apparent side effects. Plan to reassess the need to continue w/ medication in 4 months when he comes back. Apparently the medicine is also helping him at work, I suspect he may have have chronic, mild, anxiety.

## 2011-04-23 NOTE — Assessment & Plan Note (Signed)
Based on the last cholesterol panel, he started statins, good compliance and tolerance. He has a family history of heart disease, LDL goal of 100. Labs

## 2011-04-23 NOTE — Progress Notes (Signed)
  Subjective:    Patient ID: Matthew Bautista, male    DOB: 03-19-1961, 51 y.o.   MRN: [id number]  HPI Followup from previous visit, he was seen with acute anxiety and started on SSRIs, good medication compliance He was also started on cholesterol medication, good compliance. No apparent side effects.  Past Medical History:  urolithiasis, CT 3-08.... 4 mm right UVJ stone with mild dilatation of the right ureter  syncope 3-09: refused w/u , evaluation; no further episodes  Anxiety  Past Surgical History:  no   Social History:  Married, 1 child  quit tobacco-- 3-09  ETOH-- socially  Drugs-- no  job sedentary Designer, jewellery -computer)  diet-- try to eat healthy  exercise-- no    Review of Systems Patient reports that his anxiety has decreased quite a bit, is doing better. States that he is even helping he not work. Denies any depression or sleep disorder. As far as the CP  he saw cardiology, to have a stress test tomorrow. No further severe symptoms but still has from time to time a "tightness or numbness" near the left shoulder. No substernal chest pain.    Objective:   Physical Exam Alert oriented x3. No anxiety or depression noted. He seems to be in good spirits       Assessment & Plan:

## 2011-04-24 ENCOUNTER — Ambulatory Visit (INDEPENDENT_AMBULATORY_CARE_PROVIDER_SITE_OTHER): Payer: PRIVATE HEALTH INSURANCE | Admitting: Cardiovascular Disease

## 2011-04-24 DIAGNOSIS — R079 Chest pain, unspecified: Secondary | ICD-10-CM

## 2011-04-24 NOTE — Progress Notes (Signed)
Patient ID: Matthew Bautista, male   DOB: Dec 12, 1960, 51 y.o.   MRN: [id number] Exercise Treadmill Test  Pre-Exercise Testing Evaluation Rhythm: normal sinus  Rate: 88   PR:  .13 QRS:  .08  QT:  .36 QTc: .43     Test  Exercise Tolerance Test Ordering MD: Charlton Haws, MD  Interpreting MD:  Charlton Haws, MD  Unique Test No: 1  Treadmill:  1  Indication for ETT: chest pain - rule out ischemia  Contraindication to ETT: No   Stress Modality: exercise - treadmill  Cardiac Imaging Performed: non   Protocol: standard Bruce - maximal  Max BP:  169/90  Max MPHR (bpm):  172 85% MPR (bpm):  146  MPHR obtained (bpm):  193 % MPHR obtained:  120  Reached 85% MPHR (min:sec):  4:20 Total Exercise Time (min-sec):  8:00  Workload in METS:  13.4 Borg Scale: 17  Reason ETT Terminated:  fatigue    ST Segment Analysis At Rest: normal ST segments - no evidence of significant ST depression With Exercise: no evidence of significant ST depression  Other Information Arrhythmia:  No Angina during ETT:  absent (0) Quality of ETT:  diagnostic  ETT Interpretation:  normal - no evidence of ischemia by ST analysis  Comments: Normal stress test at good work load. Stages advanced at two minute intervals  Recommendations: Continue risk factor modification

## 2011-04-28 ENCOUNTER — Encounter: Payer: Self-pay | Admitting: Internal Medicine

## 2011-08-20 ENCOUNTER — Ambulatory Visit: Payer: PRIVATE HEALTH INSURANCE | Admitting: Internal Medicine

## 2011-08-22 ENCOUNTER — Encounter: Payer: Self-pay | Admitting: Internal Medicine

## 2011-08-22 ENCOUNTER — Ambulatory Visit (INDEPENDENT_AMBULATORY_CARE_PROVIDER_SITE_OTHER): Payer: PRIVATE HEALTH INSURANCE | Admitting: Internal Medicine

## 2011-08-22 VITALS — BP 122/84 | HR 69 | Temp 97.7°F | Wt 164.0 lb

## 2011-08-22 DIAGNOSIS — F419 Anxiety disorder, unspecified: Secondary | ICD-10-CM

## 2011-08-22 DIAGNOSIS — E782 Mixed hyperlipidemia: Secondary | ICD-10-CM

## 2011-08-22 DIAGNOSIS — F411 Generalized anxiety disorder: Secondary | ICD-10-CM

## 2011-08-22 DIAGNOSIS — R079 Chest pain, unspecified: Secondary | ICD-10-CM

## 2011-08-22 NOTE — Progress Notes (Signed)
  Subjective:    Patient ID: Matthew Bautista, male    DOB: 24-Oct-1960, 51 y.o.   MRN: [id number]  HPI Routine office visit Here for reevaluation of anxiety, good compliance with Celexa, no apparent side effects. The patient feels great, his "disposition" has improved significantly.  Past Medical History:   urolithiasis, CT 3-08.... 4 mm right UVJ stone with mild dilatation of the right ureter   syncope 3-09: refused w/u , evaluation; no further episodes   Anxiety    Past Surgical History:   no   Social History:   Married, 1 child   quit tobacco-- 3-09   ETOH-- socially   Drugs-- no   job sedentary Designer, jewellery -computer)     Review of Systems Reports good compliance with Lipitor without apparent side effects. He has chronic insomnia, that hasn't changed much. Denies any chest pain, shortness of breath. Self discontinue aspirin do to increased bleeding with minor cuts    Objective:   Physical Exam  General -- alert, well-developed, and well-nourished.   Lungs -- normal respiratory effort, no intercostal retractions, no accessory muscle use, and normal breath sounds.   Heart-- normal rate, regular rhythm, no murmur, and no gallop.   Extremities-- no pretibial edema bilaterally Psych-- Cognition and judgment appear intact. Alert and cooperative with normal attention span and concentration.  not anxious appearing and not depressed appearing.       Assessment & Plan:

## 2011-08-22 NOTE — Assessment & Plan Note (Signed)
Good med compliance, no change, labs on RTC

## 2011-08-22 NOTE — Assessment & Plan Note (Addendum)
Stress test was neg, asx at this point

## 2011-08-22 NOTE — Assessment & Plan Note (Signed)
Doing great, good compliance with medication without side effects. Has chronic insomnia but is not bothered by it. Denies need self any medication Plan: Continue with SSRIs

## 2011-08-22 NOTE — Patient Instructions (Signed)
Please come back in 4-5 months for a physical exam, fasting. Call for refills as needed

## 2011-10-30 ENCOUNTER — Ambulatory Visit (INDEPENDENT_AMBULATORY_CARE_PROVIDER_SITE_OTHER): Payer: PRIVATE HEALTH INSURANCE | Admitting: Family Medicine

## 2011-10-30 ENCOUNTER — Telehealth: Payer: Self-pay | Admitting: Internal Medicine

## 2011-10-30 ENCOUNTER — Encounter: Payer: Self-pay | Admitting: Family Medicine

## 2011-10-30 VITALS — BP 124/80 | HR 74 | Temp 97.9°F | Ht 71.0 in | Wt 164.6 lb

## 2011-10-30 DIAGNOSIS — L03039 Cellulitis of unspecified toe: Secondary | ICD-10-CM

## 2011-10-30 DIAGNOSIS — L03032 Cellulitis of left toe: Secondary | ICD-10-CM

## 2011-10-30 MED ORDER — AMOXICILLIN-POT CLAVULANATE 875-125 MG PO TABS: 1.0000 | ORAL_TABLET | Freq: Two times a day (BID) | ORAL | Status: AC

## 2011-10-30 NOTE — Telephone Encounter (Signed)
Patient with appointment today

## 2011-10-30 NOTE — Patient Instructions (Addendum)
Please call if no improvement of worsening in the next few days Start the Augmentin twice daily- take w/ food Warm soaks in Epsom salts Call with any questions or concerns- or if you change your mind on the podiatrist Hang in there!

## 2011-10-30 NOTE — Telephone Encounter (Signed)
Caller: Gryffin/Patient; PCP: Willow Ora; CB#: [phone]. Call regarding Infected Great Toe; Caller requesting an appt for an infected great toe. Sxs began 2-3 days ago and home care has not improved sxs. No appts with PCP. Scheduled for today, Thurs 8/1 with Dr Beverely Low at 11:30. Caller is agreeable.

## 2011-10-30 NOTE — Progress Notes (Signed)
  Subjective:    Patient ID: Matthew Bautista, male    DOB: 01/08/1961, 51 y.o.   MRN: [id number]  HPI Foot infxn- sxs started 5-7 days ago.  L big toe is painful, red, draining clear fluid.  No hx of similar.  No fevers.  No spreading redness.   Review of Systems For ROS see HPI     Objective:   Physical Exam  Skin: There is erythema (pt w/ erythema, induration, and visible pus (w/out drainable fluid collection) along medial margin of L great nail.  + TTP).          Assessment & Plan:

## 2011-11-04 NOTE — Assessment & Plan Note (Signed)
New.  Start abx.  Offered podiatry referral but pt declined.  Reviewed supportive care and red flags that should prompt return.  Pt expressed understanding and is in agreement w/ plan.

## 2011-12-09 ENCOUNTER — Other Ambulatory Visit: Payer: Self-pay | Admitting: Internal Medicine

## 2011-12-09 NOTE — Telephone Encounter (Signed)
Refill done.  

## 2012-01-19 ENCOUNTER — Encounter: Payer: Self-pay | Admitting: Internal Medicine

## 2012-01-19 ENCOUNTER — Ambulatory Visit (INDEPENDENT_AMBULATORY_CARE_PROVIDER_SITE_OTHER): Payer: PRIVATE HEALTH INSURANCE | Admitting: Internal Medicine

## 2012-01-19 VITALS — BP 120/84 | HR 68 | Temp 98.0°F | Wt 164.0 lb

## 2012-01-19 DIAGNOSIS — Z0000 Encounter for general adult medical examination without abnormal findings: Secondary | ICD-10-CM | POA: Insufficient documentation

## 2012-01-19 LAB — COMPREHENSIVE METABOLIC PANEL
CO2: 31 mEq/L (ref 19–32)
Creatinine, Ser: 0.8 mg/dL (ref 0.4–1.5)
GFR: 112.27 mL/min (ref >60.00)
Glucose, Bld: 87 mg/dL (ref 70–99)
Total Bilirubin: 0.7 mg/dL (ref 0.3–1.2)

## 2012-01-19 LAB — CBC WITH DIFFERENTIAL/PLATELET
Basophils Absolute: 0.1 10*3/uL (ref 0.0–0.1)
Eosinophils Absolute: 0.3 10*3/uL (ref 0.0–0.7)
Lymphocytes Relative: 39.6 % (ref 12.0–46.0)
MCHC: 33.8 g/dL (ref 30.0–36.0)
Neutro Abs: 2.2 10*3/uL (ref 1.4–7.7)
Neutrophils Relative %: 41.8 % — ABNORMAL LOW (ref 43.0–77.0)
RDW: 13.1 % (ref 11.5–14.6)

## 2012-01-19 LAB — LDL CHOLESTEROL, DIRECT: Direct LDL: 99.3 mg/dL

## 2012-01-19 LAB — LIPID PANEL
Cholesterol: 204 mg/dL — ABNORMAL HIGH (ref 0–200)
HDL: 74 mg/dL (ref >39.00)
VLDL: 23.8 mg/dL (ref 0.0–40.0)

## 2012-01-19 NOTE — Assessment & Plan Note (Signed)
Td 06 Flu shot -- declined  Rec a healthy diet and  more exercise + FH CAD, I rec a LDL < 130, patient aware

## 2012-01-19 NOTE — Progress Notes (Signed)
  Subjective:    Patient ID: Matthew Bautista, male    DOB: 1960-05-08, 51 y.o.   MRN: [id number]  HPI CPX  Past Medical History:   urolithiasis, CT 3-08.... 4 mm right UVJ stone with mild dilatation of the right ureter   syncope 3-09: refused w/u , evaluation; no further episodes   Anxiety    Past Surgical History:   no   Social History:   Married, 1 child   quit tobacco-- 3-09   ETOH-- socially   Drugs-- no   job sedentary Designer, jewellery -computer)   Diet--average although trying to get better Exercise-- very little   Family History: MI--father at mid 55s DM--no colon ca--no prostate ca--no  Review of Systems  Respiratory: Negative for cough and shortness of breath.   Cardiovascular: Negative for chest pain and leg swelling.  Gastrointestinal: Negative for abdominal pain and blood in stool.  Genitourinary: Negative for dysuria and difficulty urinating.  Psychiatric/Behavioral: Negative for behavioral problems. The patient is not hyperactive.        Objective:   Physical Exam General -- alert, well-developed, and well-nourished.   Neck --no thyromegaly Lungs -- normal respiratory effort, no intercostal retractions, no accessory muscle use, and normal breath sounds.   Heart-- normal rate, regular rhythm, no murmur, and no gallop.   Abdomen--soft, non-tender, no distention, no masses, no HSM, no guarding, and no rigidity.   Extremities-- no pretibial edema bilaterally  Neurologic-- alert & oriented X3 and strength normal in all extremities. Psych-- Cognition and judgment appear intact. Alert and cooperative with normal attention span and concentration.  not anxious appearing and not depressed appearing.       Assessment & Plan:

## 2012-01-19 NOTE — Patient Instructions (Addendum)
Ask your pharmacy for refills whenever needed. See you next year.

## 2012-02-11 ENCOUNTER — Other Ambulatory Visit: Payer: Self-pay | Admitting: Internal Medicine

## 2012-02-12 NOTE — Telephone Encounter (Signed)
Rx sent.    MW 

## 2012-03-08 IMAGING — CR DG CHEST 2V
2 series · 2 of 2 positions shown · non-contrast
Comparison: None.

CLINICAL DATA: Chest pain

CHEST - 2 VIEW

[w chest pa]
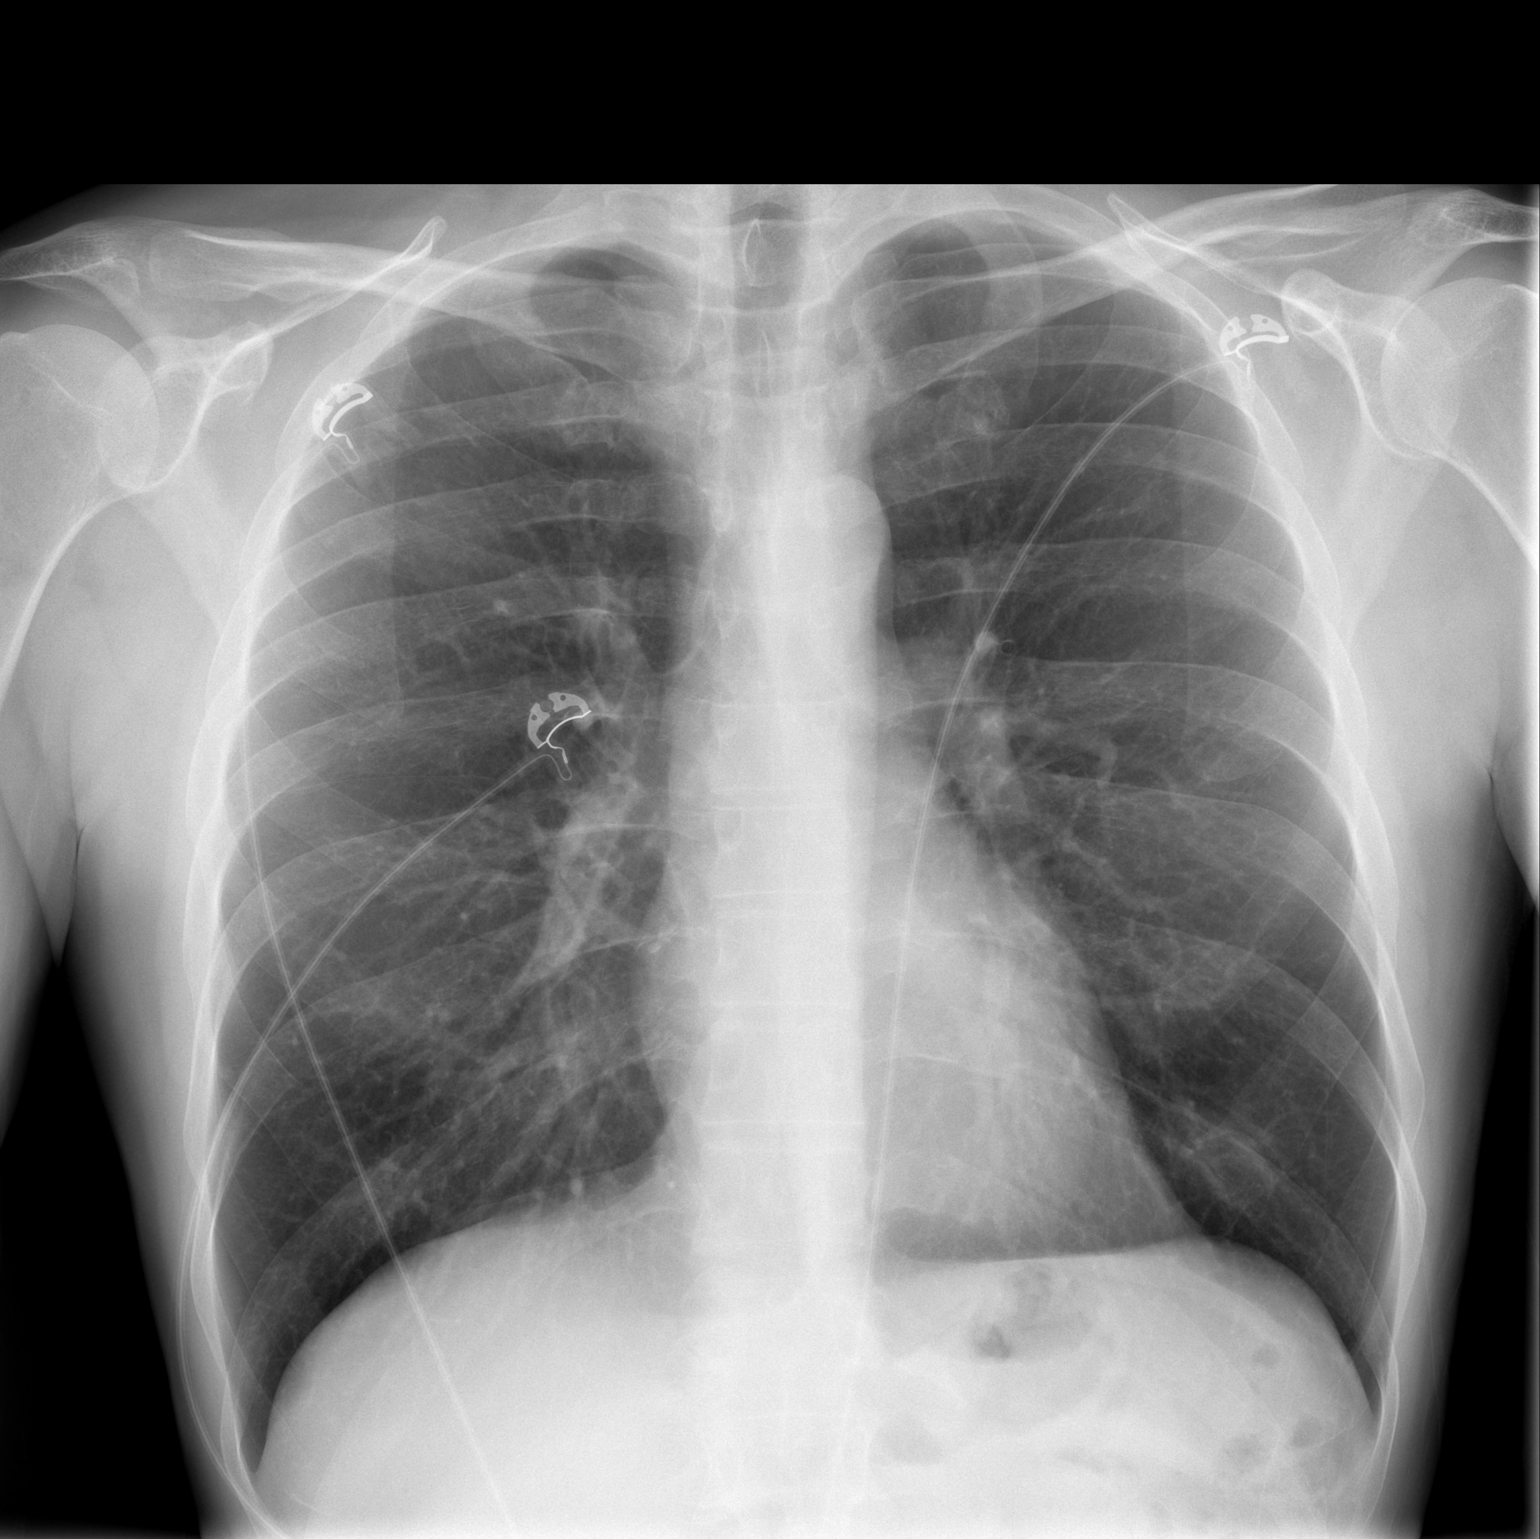

[w chest lat]
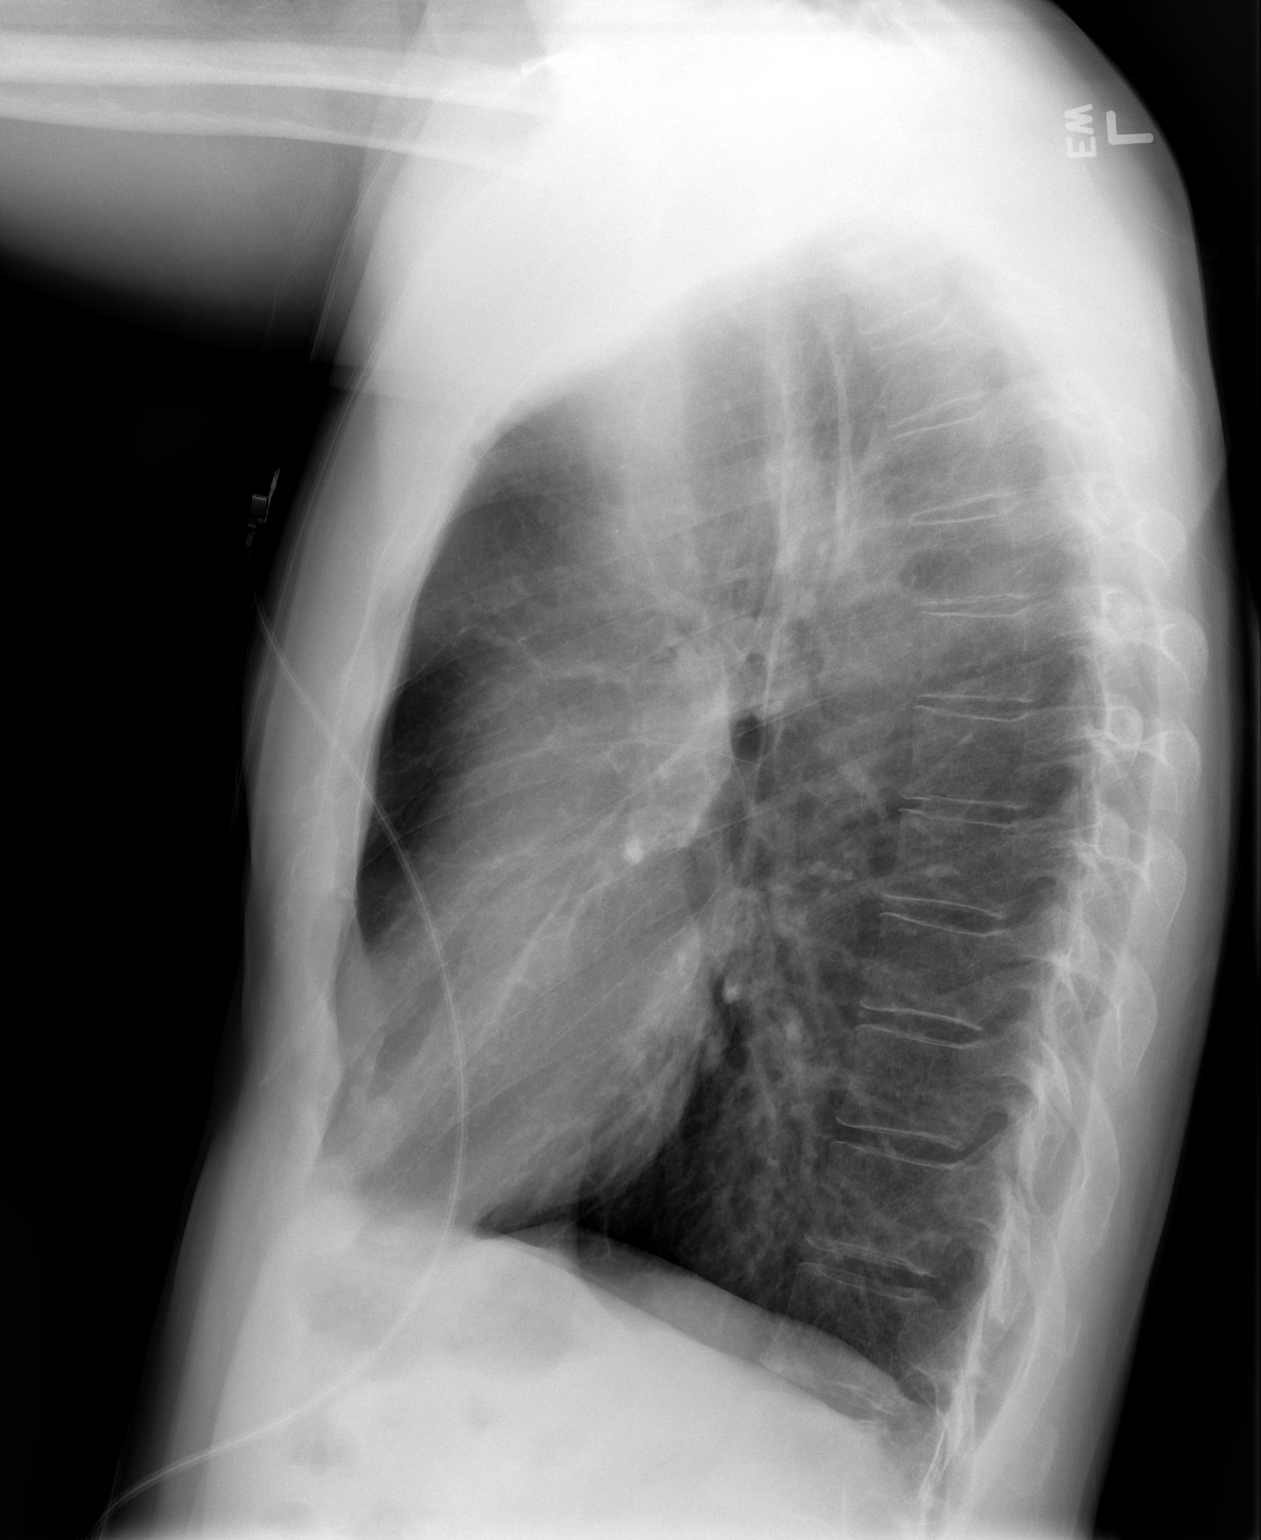

[2 of 2 positions shown; findings below may reference images not displayed]

FINDINGS: Cardiomediastinal silhouette is unremarkable.  No acute
infiltrate or pleural effusion.  No pulmonary edema.  Bony thorax
is unremarkable.
IMPRESSION: No active disease.

## 2012-04-09 ENCOUNTER — Other Ambulatory Visit: Payer: Self-pay | Admitting: Internal Medicine

## 2012-04-09 NOTE — Telephone Encounter (Signed)
Refill done.  

## 2012-05-15 ENCOUNTER — Other Ambulatory Visit: Payer: Self-pay

## 2012-11-16 ENCOUNTER — Other Ambulatory Visit: Payer: Self-pay | Admitting: Internal Medicine

## 2012-11-16 NOTE — Telephone Encounter (Signed)
Pt. Last OV 10.21.13, annual exam per protocol this request must go to provider for Celexa refill since pt. Has not been seen in last 6 months.  Pt. Has pending apt. On 01/24/13 Also per protocol, Lipitor can only be filled for 30 days, this will not be long enough to reach scheduled annual exam. Please advise on both. Thanks.

## 2012-11-16 NOTE — Telephone Encounter (Signed)
Refill done per orders.  

## 2012-11-16 NOTE — Telephone Encounter (Signed)
Ok rx both, 90 day supply (30 and 3 Rf or 90 no RF)

## 2013-01-19 ENCOUNTER — Telehealth: Payer: Self-pay

## 2013-01-19 NOTE — Telephone Encounter (Signed)
Medication List and allergies: reviewed and updated  90 day supply/mail order: na Local prescriptions: [facility]  Immunizations due: flu vaccine  A/P:  No changes to [facility], FH  CCS due  To Discuss with Provider: Nothing at this time

## 2013-01-24 ENCOUNTER — Encounter: Payer: Self-pay | Admitting: Internal Medicine

## 2013-01-24 ENCOUNTER — Ambulatory Visit (INDEPENDENT_AMBULATORY_CARE_PROVIDER_SITE_OTHER): Payer: Commercial Managed Care - PPO | Admitting: Internal Medicine

## 2013-01-24 VITALS — BP 142/93 | HR 71 | Temp 98.8°F | Ht 71.0 in | Wt 178.2 lb

## 2013-01-24 DIAGNOSIS — Z0000 Encounter for general adult medical examination without abnormal findings: Secondary | ICD-10-CM

## 2013-01-24 DIAGNOSIS — Z79899 Other long term (current) drug therapy: Secondary | ICD-10-CM

## 2013-01-24 LAB — LIPID PANEL
Cholesterol: 216 mg/dL — ABNORMAL HIGH (ref 0–200)
HDL: 69.8 mg/dL
Total CHOL/HDL Ratio: 3
Triglycerides: 78 mg/dL (ref 0.0–149.0)
VLDL: 15.6 mg/dL (ref 0.0–40.0)

## 2013-01-24 LAB — TSH: TSH: 1.77 u[IU]/mL (ref 0.35–5.50)

## 2013-01-24 LAB — LDL CHOLESTEROL, DIRECT: Direct LDL: 139.9 mg/dL

## 2013-01-24 LAB — CBC WITH DIFFERENTIAL/PLATELET
Basophils Absolute: 0 10*3/uL (ref 0.0–0.1)
Hemoglobin: 14.5 g/dL (ref 13.0–17.0)
Lymphocytes Relative: 38.4 % (ref 12.0–46.0)
Monocytes Relative: 12.1 % — ABNORMAL HIGH (ref 3.0–12.0)
Neutrophils Relative %: 44.3 % (ref 43.0–77.0)
Platelets: 244 10*3/uL (ref 150.0–400.0)
RDW: 12.9 % (ref 11.5–14.6)

## 2013-01-24 LAB — COMPREHENSIVE METABOLIC PANEL WITH GFR
ALT: 20 U/L (ref 0–53)
AST: 22 U/L (ref 0–37)
Albumin: 4.5 g/dL (ref 3.5–5.2)
Alkaline Phosphatase: 64 U/L (ref 39–117)
BUN: 11 mg/dL (ref 6–23)
CO2: 29 meq/L (ref 19–32)
Calcium: 9.3 mg/dL (ref 8.4–10.5)
Chloride: 101 meq/L (ref 96–112)
Creatinine, Ser: 0.7 mg/dL (ref 0.4–1.5)
GFR: 122.63 mL/min
Glucose, Bld: 91 mg/dL (ref 70–99)
Potassium: 4.5 meq/L (ref 3.5–5.1)
Sodium: 140 meq/L (ref 135–145)
Total Bilirubin: 1.1 mg/dL (ref 0.3–1.2)
Total Protein: 7.4 g/dL (ref 6.0–8.3)

## 2013-01-24 LAB — VITAMIN B12: Vitamin B-12: 261 pg/mL (ref 211–911)

## 2013-01-24 LAB — FOLATE: Folate: >24.8 ng/mL

## 2013-01-24 LAB — PSA: PSA: 1.23 ng/mL (ref 0.10–4.00)

## 2013-01-24 NOTE — Patient Instructions (Signed)
Get your blood work before you leave  Next visit in 4 months for fatigue follow up . No Fasting Please make an appointment    Check the  blood pressure 2 or 3 times a month   be sure it is between 110/60 and 140/85. Ideal blood pressure is 120/80. If it is consistently higher or lower, let me know

## 2013-01-24 NOTE — Assessment & Plan Note (Addendum)
Td 06 zostavax , flu shot: discussed benefits, declined  healthy diet and  more exercise discussed  + FH CAD, I rec a LDL < 130, stress test (-) 2013 BP slt elevated, see instructions Anxiety, hyperlipidemia well-controlled, no change, labs Complains of fatigue, see history of present illness, labs including a testosterone level  Next visit in 4 months to recheck fatigue

## 2013-01-24 NOTE — Progress Notes (Signed)
  Subjective:    Patient ID: Matthew Bautista, male    DOB: 01/29/1961, 52 y.o.   MRN: [id number]  HPI CPX Reports Some fatigue lately, denies feeling sleepy throughout the day, +snores sometimes. On further questioning, admits to decreased libido and occasional ED.  Past Medical History  Diagnosis Date  . SYNCOPE 05-2007    refused w/u, no further sx   . Anxiety   . Sebaceous cyst    Past Surgical History  Procedure Laterality Date  . No past surgeries     History   Social History  . Marital Status: Married    Spouse Name: N/A    Number of Children: N/A  . Years of Education: N/A   Occupational History  . Risk analyst  [employer]   Social History Main Topics  . Smoking status: Former Smoker    Quit date: 05/30/2007  . Smokeless tobacco: Never Used  . Alcohol Use: Yes  . Drug Use: No  . Sexual Activity: Not on file   Other Topics Concern  . Not on file   Social History Narrative  . No narrative on file   Family History  Problem Relation Age of Onset  . Arrhythmia Neg Hx   . Clotting disorder Neg Hx   . Fainting Neg Hx   . Heart attack Father     at age 85  . Hyperlipidemia Neg Hx   . Hypertension Neg Hx   . Colon cancer Neg Hx   . Prostate cancer Neg Hx   . Diabetes Neg Hx     Review of Systems Diet-- healthy most of the time, eats out frequently Exercise-- sedentary except for being active at work  No  CP, SOB, lower extremity edema , DOE Denies  nausea, vomiting diarrhea Denies  blood in the stools (-) cough, sputum production , chest congestion No dysuria, gross hematuria, difficulty urinating   No anxiety, depression      Objective:   Physical Exam BP 142/93  Pulse 71  Temp(Src) 98.8 F (37.1 C)  Ht 5\' 11"  (1.803 m)  Wt 178 lb 3.2 oz (80.831 kg)  BMI 24.86 kg/m2  SpO2 98% General -- alert, well-developed, NAD.  Neck --no thyromegaly Lungs -- normal respiratory effort, no intercostal retractions, no accessory muscle use, and  normal breath sounds.  Heart-- normal rate, regular rhythm, no murmur.  Abdomen-- Not distended, good bowel sounds,soft, non-tender. Rectal-- No external abnormalities noted. Normal sphincter tone. No rectal masses or tenderness. Brown stool, Hemoccult negative  Prostate--Prostate gland firm and smooth, no enlargement, nodularity, tenderness, mass, asymmetry or induration. Extremities-- no pretibial edema bilaterally  Neurologic--  alert & oriented X3. Speech normal, gait normal, strength normal in all extremities.  Psych-- Cognition and judgment appear intact. Cooperative with normal attention span and concentration. No anxious appearing , no depressed appearing.     Assessment & Plan:

## 2013-01-25 LAB — TESTOSTERONE, FREE, TOTAL, SHBG
Testosterone, Free: 66.5 pg/mL (ref 47.0–244.0)
Testosterone-% Free: 2 % (ref 1.6–2.9)
Testosterone: 329 ng/dL (ref 300–890)

## 2013-01-26 MED ORDER — VITAMIN D (ERGOCALCIFEROL) 1.25 MG (50000 UNIT) PO CAPS: 50000.0000 [IU] | ORAL_CAPSULE | ORAL | Status: DC

## 2013-01-26 NOTE — Addendum Note (Signed)
Addended by: Eustace Quail on: 01/26/2013 11:12 AM   Modules accepted: Orders

## 2013-02-03 ENCOUNTER — Other Ambulatory Visit: Payer: Self-pay

## 2013-02-18 ENCOUNTER — Other Ambulatory Visit: Payer: Self-pay | Admitting: Internal Medicine

## 2013-02-18 NOTE — Telephone Encounter (Signed)
Atorvastatin and Citalopram refilled per protocol

## 2013-04-13 ENCOUNTER — Telehealth: Payer: Self-pay | Admitting: *Deleted

## 2013-04-13 NOTE — Telephone Encounter (Signed)
I doubt there is a  Relationship but  recommend to stop vitamin D. If the pain is severe needs to be seen otherwise we can check on that when he comes back for a checkup in February

## 2013-04-13 NOTE — Telephone Encounter (Signed)
Called and spoke with patient. He said he is fine with waiting until February. JG//CMA

## 2013-04-13 NOTE — Telephone Encounter (Signed)
Patient called and stated that since taking Vitamin D3 50,000 units per week that he is now experiencing back pain. Please advise.

## 2013-05-21 ENCOUNTER — Other Ambulatory Visit: Payer: Self-pay | Admitting: Internal Medicine

## 2013-05-27 ENCOUNTER — Ambulatory Visit: Payer: Commercial Managed Care - PPO | Admitting: Internal Medicine

## 2013-09-01 ENCOUNTER — Other Ambulatory Visit: Payer: Self-pay | Admitting: Internal Medicine

## 2013-12-08 ENCOUNTER — Other Ambulatory Visit: Payer: Self-pay | Admitting: Internal Medicine

## 2014-02-20 ENCOUNTER — Telehealth: Payer: Self-pay | Admitting: Internal Medicine

## 2014-02-20 NOTE — Telephone Encounter (Signed)
Requesting refill on Celexa, [facility]

## 2014-02-20 NOTE — Telephone Encounter (Signed)
Appointment scheduled for 11/24

## 2014-02-20 NOTE — Telephone Encounter (Signed)
Pt wants to be seen this week, going on vacation. Please advise.

## 2014-02-20 NOTE — Telephone Encounter (Signed)
Pt has not been seen since 01/24/2013. He will need to make F/U appt for refills.

## 2014-02-20 NOTE — Telephone Encounter (Signed)
If we don't have anything this week, have Pt schedule appt after his vacation and I will refill enough until then.

## 2014-02-21 ENCOUNTER — Encounter: Payer: Self-pay | Admitting: Internal Medicine

## 2014-02-21 ENCOUNTER — Ambulatory Visit (INDEPENDENT_AMBULATORY_CARE_PROVIDER_SITE_OTHER): Payer: Commercial Managed Care - PPO | Admitting: Internal Medicine

## 2014-02-21 VITALS — BP 145/98 | HR 81 | Temp 97.4°F | Wt 179.0 lb

## 2014-02-21 DIAGNOSIS — E559 Vitamin D deficiency, unspecified: Secondary | ICD-10-CM

## 2014-02-21 DIAGNOSIS — E782 Mixed hyperlipidemia: Secondary | ICD-10-CM

## 2014-02-21 DIAGNOSIS — F419 Anxiety disorder, unspecified: Secondary | ICD-10-CM

## 2014-02-21 LAB — VITAMIN D 25 HYDROXY (VIT D DEFICIENCY, FRACTURES): VITD: 27.24 ng/mL — ABNORMAL LOW (ref 30.00–100.00)

## 2014-02-21 LAB — AST: AST: 21 U/L (ref 0–37)

## 2014-02-21 LAB — LIPID PANEL
CHOL/HDL RATIO: 3
Cholesterol: 185 mg/dL (ref 0–200)
HDL: 59.2 mg/dL (ref >39.00)
LDL Cholesterol: 99 mg/dL (ref 0–99)
NONHDL: 125.8
Triglycerides: 132 mg/dL (ref 0.0–149.0)
VLDL: 26.4 mg/dL (ref 0.0–40.0)

## 2014-02-21 LAB — ALT: ALT: 20 U/L (ref 0–53)

## 2014-02-21 MED ORDER — CITALOPRAM HYDROBROMIDE 20 MG PO TABS: 20.0000 mg | ORAL_TABLET | Freq: Every day | ORAL | Status: DC

## 2014-02-21 MED ORDER — ATORVASTATIN CALCIUM 10 MG PO TABS: 10.0000 mg | ORAL_TABLET | Freq: Every day | ORAL | Status: DC

## 2014-02-21 NOTE — Progress Notes (Signed)
   Subjective:    Patient ID: Matthew Bautista, male    DOB: 09-Apr-1960, 53 y.o.   MRN: [id number]  DOS:  02/21/2014 Type of visit - description : rov Interval history: Hyperlipidemia, good compliance with Lipitor Refill Anxiety, on SSRIs, Symptoms Well-Controlled Vitamin D Deficiency, Status Post Ergocalciferol, Now Taking a Multivitamin Daily.    ROS Denies chest pain or difficulty breathing No nausea, vomiting, diarrhea. No unusual myalgias. We also discussed a flu shot, declined. Would like to get a Zostavax, likes to clear the coverage with his insurance first  Past Medical History  Diagnosis Date  . SYNCOPE 05-2007    refused w/u, no further sx   . Anxiety   . Sebaceous cyst     Past Surgical History  Procedure Laterality Date  . No past surgeries      History   Social History  . Marital Status: Married    Spouse Name: N/A    Number of Children: N/A  . 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 Smoking status: Former Smoker    Quit date: 05/30/2007  . Smokeless tobacco: Never Used  . Alcohol Use: Yes  . Drug Use: No  . Sexual Activity: Not on file   Other Topics Concern  . Not on file   Social History Narrative        Medication List       This list is accurate as of: 02/21/14  7:23 PM.  Always use your most recent med list.               atorvastatin 10 MG tablet  Commonly known as:  LIPITOR  Take 1 tablet (10 mg total) by mouth at bedtime.     citalopram 20 MG tablet  Commonly known as:  CELEXA  Take 1 tablet (20 mg total) by mouth daily.     multivitamin tablet  Take 1 tablet by mouth daily.           Objective:   Physical Exam BP 145/98 mmHg  Pulse 81  Temp(Src) 97.4 F (36.3 C) (Oral)  Wt 179 lb (81.194 kg)  SpO2 96%  General -- alert, well-developed, NAD.  Lungs -- normal respiratory effort, no intercostal retractions, no accessory muscle use,  and normal breath sounds.  Heart-- normal rate, regular rhythm, no murmur.   Extremities-- no pretibial edema bilaterally  Neurologic--  alert & oriented X3. Speech normal, gait appropriate for age, strength symmetric and appropriate for age.  Psych-- Cognition and judgment appear intact. Cooperative with normal attention span and concentration. No anxious or depressed appearing.       Assessment & Plan:   We also discussed a flu shot, declined. Would like to get a Zostavax, likes to clear the coverage with his insurance first   Hyperlipidemia, good compliance with medications, check FLP, AST, ALT, refill Lipitor  Anxiety, continue with Celexa, symptoms well-controlled

## 2014-02-21 NOTE — Patient Instructions (Addendum)
Get your blood work before you leave   Check the  blood pressure 2 or 3 times a month   Be sure your blood pressure is between  145/85  and 110/65.  if it is consistently higher or lower, let me know    Please come back to the office in 6 months  for a physical exam. Come back fasting

## 2014-02-21 NOTE — Assessment & Plan Note (Signed)
Vitamin D deficiency, status post ergocalciferol, recheck levels.

## 2014-02-21 NOTE — Progress Notes (Signed)
Pre visit review using our clinic review tool, if applicable. No additional management support is needed unless otherwise documented below in the visit note. 

## 2014-02-22 ENCOUNTER — Other Ambulatory Visit: Payer: Self-pay

## 2014-02-22 MED ORDER — VITAMIN D (ERGOCALCIFEROL) 1.25 MG (50000 UNIT) PO CAPS: 50000.0000 [IU] | ORAL_CAPSULE | ORAL | Status: DC

## 2014-03-17 ENCOUNTER — Other Ambulatory Visit: Payer: Self-pay | Admitting: Internal Medicine

## 2014-08-03 ENCOUNTER — Telehealth: Payer: Self-pay | Admitting: Internal Medicine

## 2014-08-03 NOTE — Telephone Encounter (Signed)
Pre Visit letter sent  °

## 2014-08-23 ENCOUNTER — Telehealth: Payer: Self-pay | Admitting: *Deleted

## 2014-08-23 ENCOUNTER — Encounter: Payer: Self-pay | Admitting: *Deleted

## 2014-08-23 NOTE — Telephone Encounter (Signed)
Unable to reach patient at time of Pre-Visit Call.  Left message for patient to return call when available.    

## 2014-08-23 NOTE — Addendum Note (Signed)
Addended by: Leticia Penna A on: 08/23/2014 10:16 AM   Modules accepted: Orders, Medications

## 2014-08-23 NOTE — Telephone Encounter (Signed)
Pre-Visit Call completed with patient and chart updated.   Pre-Visit Info documented in Specialty Comments under [employer].    

## 2014-08-24 ENCOUNTER — Encounter: Payer: Self-pay | Admitting: Internal Medicine

## 2014-08-24 ENCOUNTER — Ambulatory Visit (INDEPENDENT_AMBULATORY_CARE_PROVIDER_SITE_OTHER): Payer: Commercial Managed Care - PPO | Admitting: Internal Medicine

## 2014-08-24 VITALS — BP 128/58 | HR 73 | Temp 97.7°F | Ht 71.0 in | Wt 177.2 lb

## 2014-08-24 DIAGNOSIS — Z0000 Encounter for general adult medical examination without abnormal findings: Secondary | ICD-10-CM | POA: Diagnosis not present

## 2014-08-24 DIAGNOSIS — Z23 Encounter for immunization: Secondary | ICD-10-CM | POA: Diagnosis not present

## 2014-08-24 DIAGNOSIS — F419 Anxiety disorder, unspecified: Secondary | ICD-10-CM

## 2014-08-24 LAB — COMPREHENSIVE METABOLIC PANEL
ALBUMIN: 4.6 g/dL (ref 3.5–5.2)
ALK PHOS: 68 U/L (ref 39–117)
ALT: 14 U/L (ref 0–53)
AST: 16 U/L (ref 0–37)
BUN: 10 mg/dL (ref 6–23)
CALCIUM: 9.3 mg/dL (ref 8.4–10.5)
CO2: 26 mEq/L (ref 19–32)
Chloride: 107 mEq/L (ref 96–112)
Creatinine, Ser: 0.81 mg/dL (ref 0.40–1.50)
GFR: 106.38 mL/min (ref >60.00)
Glucose, Bld: 82 mg/dL (ref 70–99)
POTASSIUM: 4.1 meq/L (ref 3.5–5.1)
SODIUM: 140 meq/L (ref 135–145)
Total Bilirubin: 0.6 mg/dL (ref 0.2–1.2)
Total Protein: 7.1 g/dL (ref 6.0–8.3)

## 2014-08-24 LAB — LIPID PANEL
Cholesterol: 196 mg/dL (ref 0–200)
HDL: 58.3 mg/dL (ref >39.00)
LDL Cholesterol: 107 mg/dL — ABNORMAL HIGH (ref 0–99)
NonHDL: 137.7
Total CHOL/HDL Ratio: 3
Triglycerides: 153 mg/dL — ABNORMAL HIGH (ref 0.0–149.0)
VLDL: 30.6 mg/dL (ref 0.0–40.0)

## 2014-08-24 LAB — CBC WITH DIFFERENTIAL/PLATELET
BASOS PCT: 0.9 % (ref 0.0–3.0)
Basophils Absolute: 0 10*3/uL (ref 0.0–0.1)
Eosinophils Absolute: 0.3 10*3/uL (ref 0.0–0.7)
Eosinophils Relative: 7.1 % — ABNORMAL HIGH (ref 0.0–5.0)
HCT: 41.2 % (ref 39.0–52.0)
HEMOGLOBIN: 14.3 g/dL (ref 13.0–17.0)
Lymphocytes Relative: 44.7 % (ref 12.0–46.0)
Lymphs Abs: 1.8 10*3/uL (ref 0.7–4.0)
MCHC: 34.7 g/dL (ref 30.0–36.0)
MCV: 96.1 fl (ref 78.0–100.0)
Monocytes Absolute: 0.5 10*3/uL (ref 0.1–1.0)
Monocytes Relative: 12.5 % — ABNORMAL HIGH (ref 3.0–12.0)
Neutro Abs: 1.4 10*3/uL (ref 1.4–7.7)
Neutrophils Relative %: 34.8 % — ABNORMAL LOW (ref 43.0–77.0)
Platelets: 237 10*3/uL (ref 150.0–400.0)
RBC: 4.29 Mil/uL (ref 4.22–5.81)
RDW: 13.3 % (ref 11.5–15.5)
WBC: 4 10*3/uL (ref 4.0–10.5)

## 2014-08-24 LAB — PSA: PSA: 1.12 ng/mL (ref 0.10–4.00)

## 2014-08-24 MED ORDER — CITALOPRAM HYDROBROMIDE 10 MG PO TABS: 15.0000 mg | ORAL_TABLET | Freq: Every day | ORAL | Status: DC

## 2014-08-24 MED ORDER — ATORVASTATIN CALCIUM 10 MG PO TABS: 10.0000 mg | ORAL_TABLET | Freq: Every day | ORAL | Status: DC

## 2014-08-24 NOTE — Assessment & Plan Note (Signed)
Patient feels ready to come off of SSRIs, will decrease citalopram gradually, see instructions.

## 2014-08-24 NOTE — Patient Instructions (Signed)
Get your blood work before you leave   Citalopram 10 mg 1.5 tablets daily for a month 1 tablet daily for a month Half tablet daily for month Then stop

## 2014-08-24 NOTE — Progress Notes (Signed)
Subjective:    Patient ID: Matthew Bautista, male    DOB: 07/19/1960, 54 y.o.   MRN: [id number]  DOS:  08/24/2014 Type of visit - description : cpx Interval history: Doing very well, would like to get off SSRIs.   Review of Systems  Constitutional: No fever. No chills. No unexplained wt changes. No unusual sweats  HEENT: No dental problems, no ear discharge, no facial swelling, no voice changes. No eye discharge, no eye  redness , no  intolerance to light   Respiratory: No wheezing , no  difficulty breathing. No cough , no mucus production  Cardiovascular: No CP, no leg swelling , no  Palpitations  GI: no nausea, no vomiting, no diarrhea , no  abdominal pain.  No blood in the stools. No dysphagia, no odynophagia    Endocrine: No polyphagia, no polyuria , no polydipsia  GU: No dysuria, gross hematuria, difficulty urinating. No urinary urgency, no frequency.  Musculoskeletal: No joint swellings or unusual aches or pains  Skin: No change in the color of the skin, palor , no  Rash  Allergic, immunologic: No environmental allergies , no  food allergies  Neurological: No dizziness no  syncope. No headaches. No diplopia, no slurred, no slurred speech, no motor deficits, no facial  Numbness  Hematological: No enlarged lymph nodes, no easy bruising , no unusual bleedings  Psychiatry: No suicidal ideas, no hallucinations, no beavior problems, no confusion.  No unusual/severe anxiety, no depression   Past Medical History  Diagnosis Date  . SYNCOPE 05-2007    refused w/u, no further sx   . Anxiety   . Sebaceous cyst     Past Surgical History  Procedure Laterality Date  . No past surgeries      History   Social History  . Marital Status: Married    Spouse Name: N/A  . Number of Children: 1  . 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 Smoking status: Former Smoker    Quit date: 05/30/2007  .  Smokeless tobacco: Never Used  . Alcohol Use: Yes  . Drug Use: No  . Sexual Activity: Not on file   Other Topics Concern  . Not on file   Social History Narrative   Household -- pt , wife , son 52 y/o     Family History  Problem Relation Age of Onset  . Arrhythmia Neg Hx   . Clotting disorder Neg Hx   . Fainting Neg Hx   . Heart attack Father     at age 4  . Hyperlipidemia Neg Hx   . Hypertension Neg Hx   . Colon cancer Neg Hx   . Prostate cancer Neg Hx   . Diabetes Neg Hx        Medication List       This list is accurate as of: 08/24/14  2:07 PM.  Always use your most recent med list.               atorvastatin 10 MG tablet  Commonly known as:  LIPITOR  Take 1 tablet (10 mg total) by mouth at bedtime.     BIOTIN PO  Take 1 tablet by mouth daily.     citalopram 10 MG tablet  Commonly known as:  CELEXA  Take 1.5 tablets (15 mg total) by mouth daily.     GLUCOSAMINE HCL-MSM PO  Take 1 tablet by  mouth daily.     LUTEIN PO  Take 1 tablet by mouth daily.     multivitamin tablet  Take 1 tablet by mouth daily.           Objective:   Physical Exam BP 128/58 mmHg  Pulse 73  Temp(Src) 97.7 F (36.5 C) (Oral)  Ht 5\' 11"  (1.803 m)  Wt 177 lb 4 oz (80.4 kg)  BMI 24.73 kg/m2  SpO2 98%  General:   Well developed, well nourished . NAD.  Neck:  Full range of motion. Supple. No  Thyromegaly  HEENT:  Normocephalic . Face symmetric, atraumatic Lungs:  CTA B Normal respiratory effort, no intercostal retractions, no accessory muscle use. Heart: RRR,  no murmur.  No pretibial edema bilaterally  Abdomen:  Not distended, soft, non-tender. No rebound or rigidity. No mass,organomegaly Skin: Exposed areas without rash. Not pale. Not jaundice Rectal:  External abnormalities: none. Normal sphincter tone. No rectal masses or tenderness.  Stool brown  Prostate: Prostate gland firm and smooth, no enlargement, nodularity, tenderness, mass, asymmetry or  induration.  Neurologic:  alert & oriented X3.  Speech normal, gait appropriate for age and unassisted Strength symmetric and appropriate for age.  Psych: Cognition and judgment appear intact.  Cooperative with normal attention span and concentration.  Behavior appropriate. No anxious or depressed appearing.       Assessment & Plan:

## 2014-08-24 NOTE — Progress Notes (Signed)
Pre visit review using our clinic review tool, if applicable. No additional management support is needed unless otherwise documented below in the visit note. 

## 2014-08-24 NOTE — Assessment & Plan Note (Addendum)
Td 07-2014 zostavax , flu shot: discussed benefits before    + FH CAD, I rec a LDL < 130, stress test (-) 2013. Patient is on statins  Colon cancer screening modalities discussed, will think about a colonoscopy and let me know when he decides to proceed Prostate cancer screening, DRE normal today, check a PSA  Diet improving lately Exercise : active at home-yard Labs: CMP, FLP, CBC, PSA, HIV

## 2014-08-25 LAB — HIV ANTIBODY (ROUTINE TESTING W REFLEX): HIV 1&2 Ab, 4th Generation: NONREACTIVE

## 2014-09-28 ENCOUNTER — Other Ambulatory Visit: Payer: Self-pay | Admitting: Internal Medicine

## 2014-11-02 ENCOUNTER — Other Ambulatory Visit: Payer: Self-pay | Admitting: Internal Medicine

## 2015-08-29 ENCOUNTER — Telehealth: Payer: Self-pay | Admitting: *Deleted

## 2015-08-29 NOTE — Telephone Encounter (Signed)
Pt not available at time of Pre-Visit Call. He will try to return call later if available.

## 2015-08-30 ENCOUNTER — Encounter: Payer: Self-pay | Admitting: Internal Medicine

## 2015-08-30 ENCOUNTER — Ambulatory Visit (INDEPENDENT_AMBULATORY_CARE_PROVIDER_SITE_OTHER): Payer: Commercial Managed Care - PPO | Admitting: Internal Medicine

## 2015-08-30 VITALS — BP 118/64 | HR 75 | Temp 97.7°F | Ht 71.0 in | Wt 180.2 lb

## 2015-08-30 DIAGNOSIS — R7989 Other specified abnormal findings of blood chemistry: Secondary | ICD-10-CM | POA: Diagnosis not present

## 2015-08-30 DIAGNOSIS — E559 Vitamin D deficiency, unspecified: Secondary | ICD-10-CM

## 2015-08-30 DIAGNOSIS — Z0000 Encounter for general adult medical examination without abnormal findings: Secondary | ICD-10-CM | POA: Diagnosis not present

## 2015-08-30 DIAGNOSIS — Z09 Encounter for follow-up examination after completed treatment for conditions other than malignant neoplasm: Secondary | ICD-10-CM | POA: Insufficient documentation

## 2015-08-30 LAB — CBC WITH DIFFERENTIAL/PLATELET
BASOS PCT: 1 % (ref 0.0–3.0)
Basophils Absolute: 0 10*3/uL (ref 0.0–0.1)
EOS PCT: 6.8 % — AB (ref 0.0–5.0)
Eosinophils Absolute: 0.3 10*3/uL (ref 0.0–0.7)
HEMATOCRIT: 42.1 % (ref 39.0–52.0)
HEMOGLOBIN: 14.6 g/dL (ref 13.0–17.0)
Lymphocytes Relative: 42.9 % (ref 12.0–46.0)
Lymphs Abs: 1.7 10*3/uL (ref 0.7–4.0)
MCHC: 34.6 g/dL (ref 30.0–36.0)
MCV: 94.4 fl (ref 78.0–100.0)
MONO ABS: 0.5 10*3/uL (ref 0.1–1.0)
MONOS PCT: 11.7 % (ref 3.0–12.0)
Neutro Abs: 1.5 10*3/uL (ref 1.4–7.7)
Neutrophils Relative %: 37.6 % — ABNORMAL LOW (ref 43.0–77.0)
Platelets: 250 10*3/uL (ref 150.0–400.0)
RBC: 4.46 Mil/uL (ref 4.22–5.81)
RDW: 13.3 % (ref 11.5–15.5)
WBC: 4 10*3/uL (ref 4.0–10.5)

## 2015-08-30 LAB — BASIC METABOLIC PANEL
BUN: 8 mg/dL (ref 6–23)
CHLORIDE: 105 meq/L (ref 96–112)
CO2: 27 mEq/L (ref 19–32)
Calcium: 9.2 mg/dL (ref 8.4–10.5)
Creatinine, Ser: 0.71 mg/dL (ref 0.40–1.50)
GFR: 123.36 mL/min (ref >60.00)
Glucose, Bld: 86 mg/dL (ref 70–99)
POTASSIUM: 3.9 meq/L (ref 3.5–5.1)
SODIUM: 142 meq/L (ref 135–145)

## 2015-08-30 LAB — ALT: ALT: 17 U/L (ref 0–53)

## 2015-08-30 LAB — LIPID PANEL
Cholesterol: 203 mg/dL — ABNORMAL HIGH (ref 0–200)
HDL: 69 mg/dL (ref >39.00)
NonHDL: 134.12
Total CHOL/HDL Ratio: 3
Triglycerides: 222 mg/dL — ABNORMAL HIGH (ref 0.0–149.0)
VLDL: 44.4 mg/dL — ABNORMAL HIGH (ref 0.0–40.0)

## 2015-08-30 LAB — AST: AST: 19 U/L (ref 0–37)

## 2015-08-30 LAB — LDL CHOLESTEROL, DIRECT: LDL DIRECT: 101 mg/dL

## 2015-08-30 NOTE — Progress Notes (Signed)
Pre visit review using our clinic review tool, if applicable. No additional management support is needed unless otherwise documented below in the visit note. 

## 2015-08-30 NOTE — Patient Instructions (Signed)
GO TO THE LAB : Get the blood work     GO TO THE FRONT DESK Schedule your next appointment for a  Physical exam in 1 year, fasting    If you need more information about a healthy diet visit: The American Heart Association, http://www.heart.org  The American diabetes Association  Http://www.diabetes.org

## 2015-08-30 NOTE — Assessment & Plan Note (Addendum)
Td 07-2014  + FH CAD, I rec a LDL < 130, stress test (-) 2013. Patient is on statins  3 colon cancer screening modalities pro-cons discussed , elected iFOB  Prostate cancer screening, DRE  PSA wnl 2016  Diet, Exercise- discussed Labs:BMP, AST ALT, CBC, FLP, vit D

## 2015-08-30 NOTE — Progress Notes (Signed)
Subjective:    Patient ID: Matthew Bautista, male    DOB: May 16, 1960, 55 y.o.   MRN: [id number]  DOS:  08/30/2015 Type of visit - description : CPX Interval history: No major concerns    Review of Systems Constitutional: No fever. No chills. No unexplained wt changes. No unusual sweats  HEENT: No dental problems, no ear discharge, no facial swelling, no voice changes. No eye discharge, no eye  redness , no  intolerance to light   Respiratory: No wheezing , no  difficulty breathing. No cough , no mucus production  Cardiovascular: No CP, no leg swelling , no  Palpitations  GI: no nausea, no vomiting, no diarrhea , no  abdominal pain.  No blood in the stools. No dysphagia, no odynophagia    Endocrine: No polyphagia, no polyuria , no polydipsia  GU: No dysuria, gross hematuria, difficulty urinating. No urinary urgency, no frequency.  Musculoskeletal: No joint swellings or unusual aches or pains  Skin: No change in the color of the skin, palor , no  Rash  Allergic, immunologic: No environmental allergies , no  food allergies  Neurological: No dizziness no  syncope. No headaches. No diplopia, no slurred, no slurred speech, no motor deficits, no facial  Numbness  Hematological: No enlarged lymph nodes, no easy bruising , no unusual bleedings  Psychiatry: No suicidal ideas, no hallucinations, no beavior problems, no confusion.  Sx controlled on SSRIs   Past Medical History  Diagnosis Date  . SYNCOPE 05-2007    refused w/u, no further sx   . Anxiety   . Sebaceous cyst     Past Surgical History  Procedure Laterality Date  . No past surgeries      Social History   Social History  . Marital Status: Married    Spouse Name: N/A  . Number of Children: 1  . 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 Smoking status: Former Smoker    Quit date: 05/30/2007  . Smokeless tobacco: Never Used  .  Alcohol Use: Yes  . Drug Use: No  . Sexual Activity: Not on file   Other Topics Concern  . Not on file   Social History Narrative   Household -- pt , wife , son 45 y/o     Family History  Problem Relation Age of Onset  . Arrhythmia Neg Hx   . Clotting disorder Neg Hx   . Fainting Neg Hx   . Heart attack Father     at age 66  . Hyperlipidemia Neg Hx   . Hypertension Neg Hx   . Colon cancer Neg Hx   . Prostate cancer Neg Hx   . Diabetes Neg Hx   . Brain cancer Mother        Medication List       This list is accurate as of: 08/30/15 11:39 AM.  Always use your most recent med list.               atorvastatin 10 MG tablet  Commonly known as:  LIPITOR  Take 1 tablet (10 mg total) by mouth at bedtime.     citalopram 10 MG tablet  Commonly known as:  CELEXA  Take 1.5 tablets (15 mg total) by mouth daily.     GLUCOSAMINE HCL-MSM PO  Take 1 tablet by mouth daily.     LUTEIN PO  Take 1 tablet by mouth  daily. Reported on 08/30/2015     multivitamin tablet  Take 1 tablet by mouth daily.           Objective:   Physical Exam BP 118/64 mmHg  Pulse 75  Temp(Src) 97.7 F (36.5 C) (Oral)  Ht 5\' 11"  (1.803 m)  Wt 180 lb 4 oz (81.761 kg)  BMI 25.15 kg/m2  SpO2 96% General:   Well developed, well nourished . NAD.  Neck: No  thyromegaly  HEENT:  Normocephalic . Face symmetric, atraumatic Lungs:  CTA B Normal respiratory effort, no intercostal retractions, no accessory muscle use. Heart: RRR,  no murmur.  No pretibial edema bilaterally  Abdomen:  Not distended, soft, non-tender. No rebound or rigidity.   Skin: Exposed areas without rash. Not pale. Not jaundice Neurologic:  alert & oriented X3.  Speech normal, gait appropriate for age and unassisted Strength symmetric and appropriate for age.  Psych: Cognition and judgment appear intact.  Cooperative with normal attention span and concentration.  Behavior appropriate. No anxious or depressed appearing.      Assessment & Plan:   Assessment Anxiety Syncope 05-2007, refused w/u, no further sx   PLAN: Anxiety: Controlled on SSRIs, refill as needed RTC one year

## 2015-08-30 NOTE — Assessment & Plan Note (Signed)
Anxiety: Controlled on SSRIs, refill as needed RTC one year

## 2015-09-06 ENCOUNTER — Other Ambulatory Visit (INDEPENDENT_AMBULATORY_CARE_PROVIDER_SITE_OTHER): Payer: Commercial Managed Care - PPO

## 2015-09-06 DIAGNOSIS — Z0000 Encounter for general adult medical examination without abnormal findings: Secondary | ICD-10-CM

## 2015-09-06 LAB — FECAL OCCULT BLOOD, IMMUNOCHEMICAL: FECAL OCCULT BLD: NEGATIVE

## 2015-09-20 ENCOUNTER — Other Ambulatory Visit: Payer: Self-pay | Admitting: Internal Medicine

## 2016-09-01 ENCOUNTER — Encounter: Payer: Self-pay | Admitting: Internal Medicine

## 2016-09-01 ENCOUNTER — Ambulatory Visit (INDEPENDENT_AMBULATORY_CARE_PROVIDER_SITE_OTHER): Payer: Managed Care, Other (non HMO) | Admitting: Internal Medicine

## 2016-09-01 VITALS — BP 128/64 | HR 71 | Temp 97.7°F | Resp 14 | Ht 71.0 in | Wt 176.4 lb

## 2016-09-01 DIAGNOSIS — Z0000 Encounter for general adult medical examination without abnormal findings: Secondary | ICD-10-CM

## 2016-09-01 LAB — CBC WITH DIFFERENTIAL/PLATELET
BASOS ABS: 0.1 10*3/uL (ref 0.0–0.1)
Basophils Relative: 1.4 % (ref 0.0–3.0)
EOS ABS: 0.3 10*3/uL (ref 0.0–0.7)
Eosinophils Relative: 5.8 % — ABNORMAL HIGH (ref 0.0–5.0)
HEMATOCRIT: 41.9 % (ref 39.0–52.0)
HEMOGLOBIN: 14.7 g/dL (ref 13.0–17.0)
LYMPHS PCT: 36.4 % (ref 12.0–46.0)
Lymphs Abs: 1.8 10*3/uL (ref 0.7–4.0)
MCHC: 35.1 g/dL (ref 30.0–36.0)
MCV: 96.4 fl (ref 78.0–100.0)
Monocytes Absolute: 0.6 10*3/uL (ref 0.1–1.0)
Monocytes Relative: 12 % (ref 3.0–12.0)
Neutro Abs: 2.2 10*3/uL (ref 1.4–7.7)
Neutrophils Relative %: 44.4 % (ref 43.0–77.0)
Platelets: 254 10*3/uL (ref 150.0–400.0)
RBC: 4.34 Mil/uL (ref 4.22–5.81)
RDW: 13.3 % (ref 11.5–15.5)
WBC: 5 10*3/uL (ref 4.0–10.5)

## 2016-09-01 LAB — COMPREHENSIVE METABOLIC PANEL
ALBUMIN: 4.6 g/dL (ref 3.5–5.2)
ALK PHOS: 57 U/L (ref 39–117)
ALT: 15 U/L (ref 0–53)
AST: 16 U/L (ref 0–37)
BUN: 15 mg/dL (ref 6–23)
CALCIUM: 9.5 mg/dL (ref 8.4–10.5)
CHLORIDE: 104 meq/L (ref 96–112)
CO2: 28 mEq/L (ref 19–32)
CREATININE: 0.85 mg/dL (ref 0.40–1.50)
GFR: 99.84 mL/min (ref >60.00)
Glucose, Bld: 94 mg/dL (ref 70–99)
Potassium: 4.1 mEq/L (ref 3.5–5.1)
Sodium: 140 mEq/L (ref 135–145)
TOTAL PROTEIN: 7.2 g/dL (ref 6.0–8.3)
Total Bilirubin: 0.8 mg/dL (ref 0.2–1.2)

## 2016-09-01 LAB — LIPID PANEL
Cholesterol: 189 mg/dL (ref 0–200)
HDL: 70.4 mg/dL (ref >39.00)
LDL Cholesterol: 94 mg/dL (ref 0–99)
NONHDL: 118.24
Total CHOL/HDL Ratio: 3
Triglycerides: 120 mg/dL (ref 0.0–149.0)
VLDL: 24 mg/dL (ref 0.0–40.0)

## 2016-09-01 LAB — TSH: TSH: 2.03 u[IU]/mL (ref 0.35–4.50)

## 2016-09-01 NOTE — Patient Instructions (Signed)
GO TO THE LAB : Get the blood work     GO TO THE FRONT DESK Schedule your next appointment for a  Physical exam in 1 year 

## 2016-09-01 NOTE — Progress Notes (Signed)
Pre visit review using our clinic review tool, if applicable. No additional management support is needed unless otherwise documented below in the visit note. 

## 2016-09-01 NOTE — Progress Notes (Signed)
Subjective:    Patient ID: Matthew Bautista, male    DOB: 1960-08-18, 56 y.o.   MRN: [id number]  DOS:  09/01/2016 Type of visit - description : cpx Interval history:Feeling very good, no concerns, exercise and diet better. Has lost few pounds  Wt Readings from Last 3 Encounters:  09/01/16 176 lb 6 oz (80 kg)  08/30/15 180 lb 4 oz (81.8 kg)  08/24/14 177 lb 4 oz (80.4 kg)     Review of Systems  Other than above, a 14 point review of systems is negative      Past Medical History:  Diagnosis Date  . Anxiety   . Sebaceous cyst   . SYNCOPE 05-2007   refused w/u, no further sx     Past Surgical History:  Procedure Laterality Date  . NO PAST SURGERIES      Social History   Social History  . Marital status: Married    Spouse name: N/A  . Number of children: 1  . 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 Smoking status: Former Smoker    Quit date: 05/30/2007  . Smokeless tobacco: Never Used  . Alcohol use Yes  . Drug use: No  . Sexual activity: Not on file   Other Topics Concern  . Not on file   Social History Narrative   Household -- pt , wife , son 54 y/o @ [place]     Family History  Problem Relation Age of Onset  . Heart attack Father        at age 84  . Brain cancer Mother   . Arrhythmia Neg Hx   . Clotting disorder Neg Hx   . Fainting Neg Hx   . Hyperlipidemia Neg Hx   . Hypertension Neg Hx   . Colon cancer Neg Hx   . Prostate cancer Neg Hx   . Diabetes Neg Hx     Allergies as of 09/01/2016   No Known Allergies     Medication List       Accurate as of 09/01/16  2:05 PM. Always use your most recent med list.          atorvastatin 10 MG tablet Commonly known as:  LIPITOR Take 1 tablet (10 mg total) by mouth at bedtime.   citalopram 10 MG tablet Commonly known as:  CELEXA Take 1.5 tablets (15 mg total) by mouth daily.   GLUCOSAMINE HCL-MSM PO Take 1 tablet by mouth  daily.   LUTEIN PO Take 1 tablet by mouth daily. Reported on 08/30/2015   multivitamin tablet Take 1 tablet by mouth daily.          Objective:   Physical Exam BP 128/64 (BP Location: Left Arm, Patient Position: Sitting, Cuff Size: Normal)   Pulse 71   Temp 97.7 F (36.5 C) (Oral)   Resp 14   Ht 5\' 11"  (1.803 m)   Wt 176 lb 6 oz (80 kg)   SpO2 97%   BMI 24.60 kg/m   General:   Well developed, well nourished . NAD.  Neck: No  thyromegaly  HEENT:  Normocephalic . Face symmetric, atraumatic Lungs:  CTA B Normal respiratory effort, no intercostal retractions, no accessory muscle use. Heart: RRR,  no murmur.  No pretibial edema bilaterally  Abdomen:  Not distended, soft, non-tender. No rebound or rigidity.   Skin: Exposed areas without rash. Not pale. Not jaundice Neurologic:  alert & oriented X3.  Speech normal, gait appropriate for age and unassisted Strength symmetric and appropriate for age.  Psych: Cognition and judgment appear intact.  Cooperative with normal attention span and concentration.  Behavior appropriate. No anxious or depressed appearing.    Assessment & Plan:   Assessment Anxiety Hyperlipidemia, + FH CAD LDL goal less < 130 Syncope 05-2007, refused w/u, no further sx   PLAN: Anxiety: Controlled on SSRIs, refill as needed Hyperlipidemia: Controlled, continue Lipitor RTC one year

## 2016-09-01 NOTE — Assessment & Plan Note (Signed)
Anxiety: Controlled on SSRIs, refill as needed Hyperlipidemia: Controlled, continue Lipitor RTC one year

## 2016-09-01 NOTE — Assessment & Plan Note (Addendum)
--  Td 07-2014 --  + FH CAD, I rec a LDL < 130, stress test (-) 2013. Patient is on statins -- CCS: 3 colon cancer screening modalities pro-cons discussed, ifob provided  -- Prostate cancer screening, DRE  PSA wnl 2016 , reassess next year --Labs: CMP, CBC, FLP, TSH, hep C --Diet, Exercise: Doing great. Loosing some weight.

## 2016-09-02 LAB — HEPATITIS C ANTIBODY: HCV Ab: NEGATIVE

## 2016-09-24 ENCOUNTER — Other Ambulatory Visit (INDEPENDENT_AMBULATORY_CARE_PROVIDER_SITE_OTHER): Payer: Managed Care, Other (non HMO)

## 2016-09-24 DIAGNOSIS — Z0000 Encounter for general adult medical examination without abnormal findings: Secondary | ICD-10-CM | POA: Diagnosis not present

## 2016-09-24 LAB — FECAL OCCULT BLOOD, IMMUNOCHEMICAL: Fecal Occult Bld: NEGATIVE

## 2016-11-04 ENCOUNTER — Other Ambulatory Visit: Payer: Self-pay | Admitting: Internal Medicine

## 2017-09-07 ENCOUNTER — Ambulatory Visit (INDEPENDENT_AMBULATORY_CARE_PROVIDER_SITE_OTHER): Payer: Managed Care, Other (non HMO) | Admitting: Internal Medicine

## 2017-09-07 ENCOUNTER — Encounter: Payer: Self-pay | Admitting: Internal Medicine

## 2017-09-07 ENCOUNTER — Other Ambulatory Visit: Payer: Self-pay

## 2017-09-07 VITALS — BP 140/94 | HR 86 | Temp 98.0°F | Resp 16 | Ht 71.5 in | Wt 171.0 lb

## 2017-09-07 DIAGNOSIS — Z0000 Encounter for general adult medical examination without abnormal findings: Secondary | ICD-10-CM | POA: Diagnosis not present

## 2017-09-07 LAB — BASIC METABOLIC PANEL
BUN: 8 mg/dL (ref 6–23)
CALCIUM: 9.4 mg/dL (ref 8.4–10.5)
CO2: 29 mEq/L (ref 19–32)
Chloride: 103 mEq/L (ref 96–112)
Creatinine, Ser: 0.82 mg/dL (ref 0.40–1.50)
GFR: 103.68 mL/min (ref >60.00)
Glucose, Bld: 97 mg/dL (ref 70–99)
POTASSIUM: 4.3 meq/L (ref 3.5–5.1)
SODIUM: 141 meq/L (ref 135–145)

## 2017-09-07 LAB — CBC
HEMATOCRIT: 43.1 % (ref 39.0–52.0)
Hemoglobin: 15.2 g/dL (ref 13.0–17.0)
MCHC: 35.2 g/dL (ref 30.0–36.0)
MCV: 96.9 fl (ref 78.0–100.0)
PLATELETS: 238 10*3/uL (ref 150.0–400.0)
RBC: 4.45 Mil/uL (ref 4.22–5.81)
RDW: 13.2 % (ref 11.5–15.5)
WBC: 4.5 10*3/uL (ref 4.0–10.5)

## 2017-09-07 LAB — LIPID PANEL
CHOLESTEROL: 242 mg/dL — AB (ref 0–200)
HDL: 74 mg/dL (ref >39.00)
LDL Cholesterol: 143 mg/dL — ABNORMAL HIGH (ref 0–99)
NonHDL: 168.13
TRIGLYCERIDES: 126 mg/dL (ref 0.0–149.0)
Total CHOL/HDL Ratio: 3
VLDL: 25.2 mg/dL (ref 0.0–40.0)

## 2017-09-07 LAB — PSA: PSA: 1.74 ng/mL (ref 0.10–4.00)

## 2017-09-07 NOTE — Assessment & Plan Note (Signed)
Anxiety: Currently not taking SSRIs, feels well emotionally Hyperlipidemia, was not taking Lipitor consistently so eventually decided to stop it ~2 months ago.  Had no s/e.  Rechecking labs today Elevated BP: BP was elevated when he arrived to the office, no ambulatory BPs, I recheck it and it was 150/95.  Recommend to monitor BPs monthly and come back in 6 weeks for a nurse visit.   RTC CPX 1 year

## 2017-09-07 NOTE — Assessment & Plan Note (Addendum)
--  Td 07-2014 --  + FH CAD, I rec a LDL < 130, stress test (-) 2013.  -- CCS: 3 colon cancer screening modalities , plans to call GI, declined referral -- Prostate cancer screening, DRE wnl, check a  PSA   --Labs: bmp,flp,psa,cbc --Diet, Exercise:  Counseled, doing a low carbohydrate diet

## 2017-09-07 NOTE — Patient Instructions (Signed)
GO TO THE LAB : Get the blood work     GO TO THE FRONT DESK Schedule your next appointment for a  Physical exam in 1 year    Check the  blood pressure  monthly   Be sure your blood pressure is between 110/65 and  135/85. If it is consistently higher or lower, let me know    

## 2017-09-07 NOTE — Progress Notes (Signed)
Subjective:    Patient ID: Matthew Bautista, male    DOB: 01/09/1961, 57 y.o.   MRN: [id number]  DOS:  09/07/2017 Type of visit - description : cpx Interval history:  No concerns    Review of Systems States he is feeling well, not taking any medication. A 14 point review of systems is negative    Past Medical History:  Diagnosis Date  . Anxiety   . Sebaceous cyst   . SYNCOPE 05-2007   refused w/u, no further sx     Past Surgical History:  Procedure Laterality Date  . NO PAST SURGERIES      Social History   Socioeconomic History  . Marital status: Married    Spouse name: Not on file  . Number of children: 1  . Years of education: Not on file  . Highest education level: Not on file  Occupational History  . Occupation: Web designer: Scientist, research (medical)  Social Needs  . Financial resource strain: Not on file  . Food insecurity:    Worry: Not on file    Inability: Not on file  . Transportation needs:    Medical: Not on file    Non-medical: Not on file  Tobacco Use  . Smoking status: Former Smoker    Last attempt to quit: 05/30/2007    Years since quitting: 10.2  . Smokeless tobacco: Never Used  Substance and Sexual Activity  . Alcohol use: Yes  . Drug use: No  . Sexual activity: Not on file  Lifestyle  . Physical activity:    Days per week: Not on file    Minutes per session: Not on file  . Stress: Not on file  Relationships  . Social connections:    Talks on phone: Not on file    Gets together: Not on file    Attends religious service: Not on file    Active member of club or organization: Not on file    Attends meetings of clubs or organizations: Not on file    Relationship status: Not on file  . Intimate partner violence:    Fear of current or ex partner: Not on file    Emotionally abused: Not on file    Physically abused: Not on file    Forced sexual activity: Not on file  Other Topics Concern  . Not on file  Social History Narrative     Household -- pt , wife , son 29 y/o @ [place]     Family History  Problem Relation Age of Onset  . Heart attack Father 94  . Brain cancer Mother   . Arrhythmia Neg Hx   . Clotting disorder Neg Hx   . Fainting Neg Hx   . Hyperlipidemia Neg Hx   . Hypertension Neg Hx   . Colon cancer Neg Hx   . Prostate cancer Neg Hx   . Diabetes Neg Hx      Allergies as of 09/07/2017   No Known Allergies     Medication List    as of 09/07/2017 10:27 AM   You have not been prescribed any medications.        Objective:   Physical Exam BP (!) 140/94 (BP Location: Right Arm, Patient Position: Sitting, Cuff Size: Normal)   Pulse 86   Temp 98 F (36.7 C)   Resp 16   Ht 5' 11.5" (1.816 m)   Wt 171 lb (77.6 kg)   SpO2 97%  BMI 23.52 kg/m  General:   Well developed, well nourished . NAD.  Neck: No  thyromegaly  HEENT:  Normocephalic . Face symmetric, atraumatic Lungs:  CTA B Normal respiratory effort, no intercostal retractions, no accessory muscle use. Heart: RRR,  no murmur.  No pretibial edema bilaterally  Abdomen:  Not distended, soft, non-tender. No rebound or rigidity.   Skin: Exposed areas without rash. Not pale. Not jaundice Rectal: External abnormalities: none. Normal sphincter tone. No rectal masses or tenderness.  Brown stools Prostate: Prostate gland firm and smooth, no enlargement, nodularity, tenderness, mass, asymmetry or induration Neurologic:  alert & oriented X3.  Speech normal, gait appropriate for age and unassisted Strength symmetric and appropriate for age.  Psych: Cognition and judgment appear intact.  Cooperative with normal attention span and concentration.  Behavior appropriate. No anxious or depressed appearing.     Assessment & Plan:  Assessment Anxiety Hyperlipidemia, + FH CAD LDL goal less < 130 Syncope 05-2007, refused w/u, no further sx   PLAN: Anxiety: Currently not taking SSRIs, feels well emotionally Hyperlipidemia, was not taking  Lipitor consistently so eventually decided to stop it ~2 months ago.  Had no s/e.  Rechecking labs today Elevated BP: BP was elevated when he arrived to the office, no ambulatory BPs, I recheck it and it was 150/95.  Recommend to monitor BPs monthly and come back in 6 weeks for a nurse visit.   RTC CPX 1 year

## 2017-10-20 ENCOUNTER — Ambulatory Visit (INDEPENDENT_AMBULATORY_CARE_PROVIDER_SITE_OTHER): Payer: Managed Care, Other (non HMO) | Admitting: Medical

## 2017-10-20 VITALS — BP 127/89 | HR 94

## 2017-10-20 DIAGNOSIS — R030 Elevated blood-pressure reading, without diagnosis of hypertension: Secondary | ICD-10-CM | POA: Diagnosis not present

## 2017-10-20 DIAGNOSIS — R6889 Other general symptoms and signs: Secondary | ICD-10-CM

## 2017-10-20 NOTE — Progress Notes (Addendum)
Pre visit review using our clinic tool,if applicable. No additional management support is needed unless otherwise documented below in the visit note.  Pt here for Blood pressure check per order from Dr. Larose Kells. Patient currently not taking BP medications.   Patients BP was slightly elevated on last visit = 140/94 P= 86. Per Dr. Larose Kells patient was to come back for follow up BP check.  BP today @ = 127/89 HR =94  No complaints voiced this visit.   Pt advised per Mackie Pai, PA-C, monitor BP at home if above 140/90  A couple days,call office to schedule follow up visit. With provider.  Patient agreed.   Did advise to check as stated above 2-3 times a week with over the counter bp monitor. If trend above 140/90 then make appointment for possible bp meds. Currently bp is better than last bp check with pcp.  Mackie Pai, PA-C

## 2017-11-05 ENCOUNTER — Other Ambulatory Visit: Payer: Self-pay | Admitting: Internal Medicine

## 2018-02-08 ENCOUNTER — Encounter: Payer: Self-pay | Admitting: Internal Medicine

## 2018-02-08 DIAGNOSIS — E782 Mixed hyperlipidemia: Secondary | ICD-10-CM

## 2018-02-12 MED ORDER — ATORVASTATIN CALCIUM 10 MG PO TABS: 10.0000 mg | ORAL_TABLET | Freq: Every day | ORAL | 3 refills | Status: DC

## 2018-02-12 NOTE — Telephone Encounter (Signed)
Send prescription for Lipitor 10 mg 1 p.o. daily #30 and 3 refills  Future labs: AST, ALT, FLP in 2 months.

## 2018-07-05 ENCOUNTER — Other Ambulatory Visit: Payer: Self-pay | Admitting: Internal Medicine

## 2018-07-16 ENCOUNTER — Encounter: Payer: Self-pay | Admitting: Internal Medicine

## 2018-07-19 ENCOUNTER — Other Ambulatory Visit: Payer: Self-pay

## 2018-07-19 ENCOUNTER — Ambulatory Visit (INDEPENDENT_AMBULATORY_CARE_PROVIDER_SITE_OTHER): Payer: Managed Care, Other (non HMO) | Admitting: Internal Medicine

## 2018-07-19 DIAGNOSIS — F419 Anxiety disorder, unspecified: Secondary | ICD-10-CM | POA: Diagnosis not present

## 2018-07-19 DIAGNOSIS — E782 Mixed hyperlipidemia: Secondary | ICD-10-CM

## 2018-07-19 NOTE — Assessment & Plan Note (Signed)
Anxiety: Symptoms have resurfaced lately, under more stress.  Would like to go back on citalopram.  No depression per se.  Plan: Citalopram 10 mg: Half tablet for 10 days, then 1 tablet daily.  Call if is not working as well as it did in the past Hyperlipidemia: On Lipitor consistently for the last several weeks.  No side effects.  Will check a FLP, AST, ALT Elevated BP?  No recent ambulatory BPs, patient plans to get a BP cuff. RTC: My nurse will schedule labs to be done soon. CPX in 3 months.

## 2018-07-19 NOTE — Progress Notes (Signed)
Subjective:    Patient ID: Matthew Bautista, male    DOB: Dec 25, 1960, 58 y.o.   MRN: [id number]  DOS:  07/19/2018 Type of visit - description: Virtual Visit via Video Note  I connected with@ on 07/19/18 at  9:00 AM EDT by a video enabled telemedicine application and verified that I am speaking with the correct person using two identifiers.   THIS ENCOUNTER IS A VIRTUAL VISIT DUE TO COVID-19 - PATIENT WAS NOT SEEN IN THE OFFICE. PATIENT HAS CONSENTED TO VIRTUAL VISIT / TELEMEDICINE VISIT   Location of patient: home  Location of provider: office  I discussed the limitations of evaluation and management by telemedicine and the availability of in person appointments. The patient expressed understanding and agreed to proceed.  History of Present Illness: Routine checkup High cholesterol: Taking Lipitor consistently in the last several weeks. Anxiety: Lately, he has been under more stress, wife has noted him to be irritable and somewhat distant.  Patient would like to go back on citalopram.   BP Readings from Last 3 Encounters:  10/20/17 127/89  09/07/17 (!) 140/94  09/01/16 128/64     Review of Systems Other than above,  doing well. He is trying to stay active. Diet has actually improved since the last time we talked. Denies any unusual aches or pains  Past Medical History:  Diagnosis Date  . Anxiety   . Sebaceous cyst   . SYNCOPE 05-2007   refused w/u, no further sx     Past Surgical History:  Procedure Laterality Date  . NO PAST SURGERIES      Social History   Socioeconomic History  . Marital status: Married    Spouse name: Not on file  . Number of children: 1  . Years of education: Not on file  . Highest education level: Not on file  Occupational History  . Occupation: Web designer: Scientist, research (medical)  Social Needs  . Financial resource strain: Not on file  . Food insecurity:    Worry: Not on file    Inability: Not on file  . Transportation  needs:    Medical: Not on file    Non-medical: Not on file  Tobacco Use  . Smoking status: Former Smoker    Last attempt to quit: 05/30/2007    Years since quitting: 11.1  . Smokeless tobacco: Never Used  Substance and Sexual Activity  . Alcohol use: Yes  . Drug use: No  . Sexual activity: Not on file  Lifestyle  . Physical activity:    Days per week: Not on file    Minutes per session: Not on file  . Stress: Not on file  Relationships  . Social connections:    Talks on phone: Not on file    Gets together: Not on file    Attends religious service: Not on file    Active member of club or organization: Not on file    Attends meetings of clubs or organizations: Not on file    Relationship status: Not on file  . Intimate partner violence:    Fear of current or ex partner: Not on file    Emotionally abused: Not on file    Physically abused: Not on file    Forced sexual activity: Not on file  Other Topics Concern  . Not on file  Social History Narrative   Household -- pt , wife , son 80 y/o @ [employer]      Allergies as  of 07/19/2018   No Known Allergies     Medication List       Accurate as of July 19, 2018  9:18 AM. Always use your most recent med list.        atorvastatin 10 MG tablet Commonly known as:  LIPITOR Take 1 tablet (10 mg total) by mouth daily.           Objective:   Physical Exam There were no vitals taken for this visit. This is a virtual phone visit.  Alert oriented x3, no apparent distress    Assessment     Assessment Anxiety Hyperlipidemia, + FH CAD LDL goal less < 130 Syncope 05-2007, refused w/u, no further sx   PLAN: Anxiety: Symptoms have resurfaced lately, under more stress.  Would like to go back on citalopram.  No depression per se.  Plan: Citalopram 10 mg: Half tablet for 10 days, then 1 tablet daily.  Call if is not working as well as it did in the past Hyperlipidemia: On Lipitor consistently for the last several weeks.  No side  effects.  Will check a FLP, AST, ALT Elevated BP?  No recent ambulatory BPs, patient plans to get a BP cuff. RTC: My nurse will schedule labs to be done soon. CPX in 3 months.    I discussed the assessment and treatment plan with the patient. The patient was provided an opportunity to ask questions and all were answered. The patient agreed with the plan and demonstrated an understanding of the instructions.   The patient was advised to call back or seek an in-person evaluation if the symptoms worsen or if the condition fails to improve as anticipated.

## 2018-07-20 ENCOUNTER — Encounter: Payer: Self-pay | Admitting: Internal Medicine

## 2018-07-20 ENCOUNTER — Other Ambulatory Visit: Payer: Self-pay | Admitting: Internal Medicine

## 2018-07-20 MED ORDER — CITALOPRAM HYDROBROMIDE 20 MG PO TABS: ORAL_TABLET | ORAL | 6 refills | Status: DC

## 2018-07-22 ENCOUNTER — Other Ambulatory Visit: Payer: Self-pay

## 2018-07-22 ENCOUNTER — Other Ambulatory Visit (INDEPENDENT_AMBULATORY_CARE_PROVIDER_SITE_OTHER): Payer: Managed Care, Other (non HMO)

## 2018-07-22 DIAGNOSIS — E782 Mixed hyperlipidemia: Secondary | ICD-10-CM | POA: Diagnosis not present

## 2018-07-22 LAB — LIPID PANEL
Cholesterol: 185 mg/dL (ref 0–200)
HDL: 68 mg/dL (ref >39.00)
NonHDL: 116.66
Total CHOL/HDL Ratio: 3
Triglycerides: 202 mg/dL — ABNORMAL HIGH (ref 0.0–149.0)
VLDL: 40.4 mg/dL — ABNORMAL HIGH (ref 0.0–40.0)

## 2018-07-22 LAB — AST: AST: 17 U/L (ref 0–37)

## 2018-07-22 LAB — LDL CHOLESTEROL, DIRECT: Direct LDL: 90 mg/dL

## 2018-07-22 LAB — ALT: ALT: 21 U/L (ref 0–53)

## 2018-07-31 ENCOUNTER — Encounter: Payer: Self-pay | Admitting: Internal Medicine

## 2018-08-02 MED ORDER — ATORVASTATIN CALCIUM 10 MG PO TABS: 10.0000 mg | ORAL_TABLET | Freq: Every day | ORAL | 1 refills | Status: DC

## 2018-09-09 ENCOUNTER — Encounter: Payer: Managed Care, Other (non HMO) | Admitting: Internal Medicine

## 2018-09-13 ENCOUNTER — Encounter: Payer: Managed Care, Other (non HMO) | Admitting: Internal Medicine

## 2018-09-27 ENCOUNTER — Encounter: Payer: Self-pay | Admitting: Internal Medicine

## 2018-09-29 ENCOUNTER — Encounter: Payer: Managed Care, Other (non HMO) | Admitting: Internal Medicine

## 2018-10-25 ENCOUNTER — Ambulatory Visit (INDEPENDENT_AMBULATORY_CARE_PROVIDER_SITE_OTHER): Payer: Managed Care, Other (non HMO) | Admitting: Internal Medicine

## 2018-10-25 ENCOUNTER — Other Ambulatory Visit: Payer: Self-pay

## 2018-10-25 ENCOUNTER — Encounter: Payer: Self-pay | Admitting: Internal Medicine

## 2018-10-25 ENCOUNTER — Telehealth: Payer: Self-pay

## 2018-10-25 VITALS — BP 165/98 | HR 101 | Temp 98.3°F | Resp 16 | Ht 71.5 in | Wt 176.1 lb

## 2018-10-25 DIAGNOSIS — I10 Essential (primary) hypertension: Secondary | ICD-10-CM

## 2018-10-25 DIAGNOSIS — Z0000 Encounter for general adult medical examination without abnormal findings: Secondary | ICD-10-CM

## 2018-10-25 DIAGNOSIS — E559 Vitamin D deficiency, unspecified: Secondary | ICD-10-CM

## 2018-10-25 MED ORDER — AMLODIPINE BESYLATE 5 MG PO TABS: 5.0000 mg | ORAL_TABLET | Freq: Every day | ORAL | 6 refills | Status: DC

## 2018-10-25 NOTE — Telephone Encounter (Signed)
Cologuard ordered through [employer] portal.

## 2018-10-25 NOTE — Progress Notes (Signed)
Pre visit review using our clinic review tool, if applicable. No additional management support is needed unless otherwise documented below in the visit note. 

## 2018-10-25 NOTE — Patient Instructions (Signed)
GO TO THE LAB : Get the blood work     GO TO THE FRONT DESK Schedule your next appointment for a checkup in 4 months  Start amlodipine 1 tablet daily.   Check the  blood pressure 2 or 3 times a month BP GOAL is between 110/65 and  135/85. If it is consistently higher or lower, let me know  Low-salt diet, stay physically active Visit [employer] website, they have very good information about healthy habits.

## 2018-10-25 NOTE — Assessment & Plan Note (Addendum)
-  Td 07-2014 - flu shot strongly recommended when available , benefits discussed, declined  -  + FH CAD, goal LDL < 130, stress test (-) 2013.  Reluctant to take aspirin -- CCS: did not proceed w/ GI referral, 3 options d/w pt, elected cologuard (needs to check coverage first!) -- Prostate cancer screening, DRE-  PSA  wnl 2019 --Labs: CMP, FLP, CBC, TSH, vitamin D --Diet, Exercise:  Counseled

## 2018-10-25 NOTE — Progress Notes (Signed)
Subjective:    Patient ID: Matthew Bautista, male    DOB: 1961-03-05, 58 y.o.   MRN: [id number]  DOS:  10/25/2018 Type of visit - description: Here for CPX In general feeling well, no major concerns  BP Readings from Last 3 Encounters:  10/25/18 (!) 165/98  10/20/17 127/89  09/07/17 (!) 140/94     Review of Systems   A 14 point review of systems is negative    Past Medical History:  Diagnosis Date  . Anxiety   . Sebaceous cyst   . SYNCOPE 05-2007   refused w/u, no further sx     Past Surgical History:  Procedure Laterality Date  . NO PAST SURGERIES      Social History   Socioeconomic History  . Marital status: Married    Spouse name: Not on file  . Number of children: 1  . Years of education: Not on file  . Highest education level: Not on file  Occupational History  . Occupation: Web designer: Scientist, research (medical)  Social Needs  . Financial resource strain: Not on file  . Food insecurity    Worry: Not on file    Inability: Not on file  . Transportation needs    Medical: Not on file    Non-medical: Not on file  Tobacco Use  . Smoking status: Former Smoker    Quit date: 05/30/2007    Years since quitting: 11.4  . Smokeless tobacco: Never Used  Substance and Sexual Activity  . Alcohol use: Yes  . Drug use: No  . Sexual activity: Not on file  Lifestyle  . Physical activity    Days per week: Not on file    Minutes per session: Not on file  . Stress: Not on file  Relationships  . Social Herbalist on phone: Not on file    Gets together: Not on file    Attends religious service: Not on file    Active member of club or organization: Not on file    Attends meetings of clubs or organizations: Not on file    Relationship status: Not on file  . Intimate partner violence    Fear of current or ex partner: Not on file    Emotionally abused: Not on file    Physically abused: Not on file    Forced sexual activity: Not on file  Other  Topics Concern  . Not on file  Social History Narrative   Household -- pt , wife , son 34 y/o @ [employer]   Family History  Problem Relation Age of Onset  . Heart attack Father 53  . Brain cancer Mother   . Arrhythmia Neg Hx   . Clotting disorder Neg Hx   . Fainting Neg Hx   . Hyperlipidemia Neg Hx   . Hypertension Neg Hx   . Colon cancer Neg Hx   . Prostate cancer Neg Hx   . Diabetes Neg Hx        Allergies as of 10/25/2018   No Known Allergies     Medication List       Accurate as of October 25, 2018  2:27 PM. If you have any questions, ask your nurse or doctor.        atorvastatin 10 MG tablet Commonly known as: LIPITOR Take 1 tablet (10 mg total) by mouth daily.   citalopram 20 MG tablet Commonly known as: CELEXA Half tablet daily for 10 days,  then 1 tablet daily           Objective:   Physical Exam BP (!) 165/98 (BP Location: Left Arm, Patient Position: Sitting, Cuff Size: Small)   Pulse (!) 101   Temp 98.3 F (36.8 C) (Oral)   Resp 16   Ht 5' 11.5" (1.816 m)   Wt 176 lb 2 oz (79.9 kg)   SpO2 97%   BMI 24.22 kg/m  General: Well developed, NAD, BMI noted Neck: No  thyromegaly  HEENT:  Normocephalic . Face symmetric, atraumatic Lungs:  CTA B Normal respiratory effort, no intercostal retractions, no accessory muscle use. Heart: RRR,  no murmur.  No pretibial edema bilaterally  Abdomen:  Not distended, soft, non-tender. No rebound or rigidity.   Skin: Exposed areas without rash. Not pale. Not jaundice Neurologic:  alert & oriented X3.  Speech normal, gait appropriate for age and unassisted Strength symmetric and appropriate for age.  Psych: Cognition and judgment appear intact.  Cooperative with normal attention span and concentration.  Behavior appropriate. No anxious or depressed appearing.     Assessment     ASSESSMENT HTN dx 09/2018 Anxiety Hyperlipidemia, + FH CAD LDL goal less < 130 Syncope 05-2007, refused w/u, no further sx   PLAN  Anxiety: Started escitalopram, well controlled.  No change. Hyperlipidemia: On Lipitor, checking labs HTN: BP was elevated upon arrival to the office, I rechecked it: 150/105.  Explained the patient the benefits of decreasing his BP.  Start amlodipine 5 mg, side effects discussed.  Recommend ambulatory BPs. RTC 4 months

## 2018-10-26 LAB — CBC WITH DIFFERENTIAL/PLATELET
Basophils Absolute: 0.1 10*3/uL (ref 0.0–0.1)
Basophils Relative: 1.4 % (ref 0.0–3.0)
Eosinophils Absolute: 0.2 10*3/uL (ref 0.0–0.7)
Eosinophils Relative: 3.6 % (ref 0.0–5.0)
HCT: 41.8 % (ref 39.0–52.0)
Hemoglobin: 14.2 g/dL (ref 13.0–17.0)
Lymphocytes Relative: 37.8 % (ref 12.0–46.0)
Lymphs Abs: 1.9 10*3/uL (ref 0.7–4.0)
MCHC: 34 g/dL (ref 30.0–36.0)
MCV: 98.4 fl (ref 78.0–100.0)
Monocytes Absolute: 0.6 10*3/uL (ref 0.1–1.0)
Monocytes Relative: 11.9 % (ref 3.0–12.0)
Neutro Abs: 2.3 10*3/uL (ref 1.4–7.7)
Neutrophils Relative %: 45.3 % (ref 43.0–77.0)
Platelets: 255 10*3/uL (ref 150.0–400.0)
RBC: 4.25 Mil/uL (ref 4.22–5.81)
RDW: 13 % (ref 11.5–15.5)
WBC: 5 10*3/uL (ref 4.0–10.5)

## 2018-10-26 LAB — LIPID PANEL
Cholesterol: 219 mg/dL — ABNORMAL HIGH (ref 0–200)
HDL: 80.7 mg/dL (ref >39.00)
LDL Cholesterol: 120 mg/dL — ABNORMAL HIGH (ref 0–99)
NonHDL: 137.88
Total CHOL/HDL Ratio: 3
Triglycerides: 90 mg/dL (ref 0.0–149.0)
VLDL: 18 mg/dL (ref 0.0–40.0)

## 2018-10-26 LAB — COMPREHENSIVE METABOLIC PANEL
ALT: 16 U/L (ref 0–53)
AST: 18 U/L (ref 0–37)
Albumin: 4.6 g/dL (ref 3.5–5.2)
Alkaline Phosphatase: 56 U/L (ref 39–117)
BUN: 9 mg/dL (ref 6–23)
CO2: 29 mEq/L (ref 19–32)
Calcium: 9.4 mg/dL (ref 8.4–10.5)
Chloride: 102 mEq/L (ref 96–112)
Creatinine, Ser: 0.77 mg/dL (ref 0.40–1.50)
GFR: 104.46 mL/min (ref >60.00)
Glucose, Bld: 82 mg/dL (ref 70–99)
Potassium: 4.1 mEq/L (ref 3.5–5.1)
Sodium: 140 mEq/L (ref 135–145)
Total Bilirubin: 0.7 mg/dL (ref 0.2–1.2)
Total Protein: 7 g/dL (ref 6.0–8.3)

## 2018-10-26 LAB — VITAMIN D 25 HYDROXY (VIT D DEFICIENCY, FRACTURES): VITD: 27.59 ng/mL — ABNORMAL LOW (ref 30.00–100.00)

## 2018-10-26 LAB — TSH: TSH: 0.94 u[IU]/mL (ref 0.35–4.50)

## 2018-10-26 NOTE — Assessment & Plan Note (Signed)
Anxiety: Started escitalopram, well controlled.  No change. Hyperlipidemia: On Lipitor, checking labs HTN: BP was elevated upon arrival to the office, I rechecked it: 150/105.  Explained the patient the benefits of decreasing his BP.  Start amlodipine 5 mg, side effects discussed.  Recommend ambulatory BPs. RTC 4 months

## 2018-10-28 MED ORDER — VITAMIN D (ERGOCALCIFEROL) 1.25 MG (50000 UNIT) PO CAPS: 50000.0000 [IU] | ORAL_CAPSULE | ORAL | 0 refills | Status: DC

## 2018-10-28 NOTE — Addendum Note (Signed)
Addended by: Damita Dunnings D on: 10/28/2018 02:01 PM   Modules accepted: Orders

## 2018-11-09 ENCOUNTER — Encounter: Payer: Self-pay | Admitting: Internal Medicine

## 2018-11-25 LAB — COLOGUARD: Cologuard: NEGATIVE

## 2018-11-26 ENCOUNTER — Encounter: Payer: Self-pay | Admitting: Internal Medicine

## 2019-02-14 ENCOUNTER — Other Ambulatory Visit: Payer: Self-pay

## 2019-02-14 MED ORDER — ATORVASTATIN CALCIUM 10 MG PO TABS: 10.0000 mg | ORAL_TABLET | Freq: Every day | ORAL | 0 refills | Status: DC

## 2019-02-22 ENCOUNTER — Ambulatory Visit (INDEPENDENT_AMBULATORY_CARE_PROVIDER_SITE_OTHER): Payer: Managed Care, Other (non HMO) | Admitting: Internal Medicine

## 2019-02-22 ENCOUNTER — Other Ambulatory Visit: Payer: Self-pay

## 2019-02-22 DIAGNOSIS — E559 Vitamin D deficiency, unspecified: Secondary | ICD-10-CM

## 2019-02-22 DIAGNOSIS — E782 Mixed hyperlipidemia: Secondary | ICD-10-CM

## 2019-02-22 NOTE — Progress Notes (Signed)
Subjective:    Patient ID: Matthew Bautista, male    DOB: 11-13-1960, 58 y.o.   MRN: [id number]  DOS:  02/22/2019 Type of visit - description: Virtual Visit via Video Note  I connected with@   by a video enabled telemedicine application and verified that I am speaking with the correct person using two identifiers.   THIS ENCOUNTER IS A VIRTUAL VISIT DUE TO COVID-19 - PATIENT WAS NOT SEEN IN THE OFFICE. PATIENT HAS CONSENTED TO VIRTUAL VISIT / TELEMEDICINE VISIT   Location of patient: home  Location of provider: office  I discussed the limitations of evaluation and management by telemedicine and the availability of in person appointments. The patient expressed understanding and agreed to proceed.  History of Present Illness: Follow-up HTN: Few months ago started amlodipine, no ambulatory BPs but denies side effects High cholesterol: Last FLP slightly elevated, doing great with lifestyle.  Eating healthier, low salt. Vitamin D deficiency: Status post ergocalciferol, currently on OTC vitamin D supplements.    Review of Systems Denies chest pain, difficulty breathing No weight gain No edema   Past Medical History:  Diagnosis Date  . Anxiety   . Sebaceous cyst   . SYNCOPE 05-2007   refused w/u, no further sx     Past Surgical History:  Procedure Laterality Date  . NO PAST SURGERIES      Social History   Socioeconomic History  . Marital status: Married    Spouse name: Not on file  . Number of children: 1  . Years of education: Not on file  . Highest education level: Not on file  Occupational History  . Occupation: Web designer: Scientist, research (medical)  Social Needs  . Financial resource strain: Not on file  . Food insecurity    Worry: Not on file    Inability: Not on file  . Transportation needs    Medical: Not on file    Non-medical: Not on file  Tobacco Use  . Smoking status: Former Smoker    Quit date: 05/30/2007    Years since quitting: 11.7  .  Smokeless tobacco: Never Used  Substance and Sexual Activity  . Alcohol use: Yes    Comment: socially  . Drug use: No  . Sexual activity: Not on file  Lifestyle  . Physical activity    Days per week: Not on file    Minutes per session: Not on file  . Stress: Not on file  Relationships  . Social Herbalist on phone: Not on file    Gets together: Not on file    Attends religious service: Not on file    Active member of club or organization: Not on file    Attends meetings of clubs or organizations: Not on file    Relationship status: Not on file  . Intimate partner violence    Fear of current or ex partner: Not on file    Emotionally abused: Not on file    Physically abused: Not on file    Forced sexual activity: Not on file  Other Topics Concern  . Not on file  Social History Narrative   Household -- pt , wife , son 64 y/o , finished  [employer], works at [employer] as of 02/22/2019   No Known Allergies     Medication List       Accurate as of February 22, 2019 11:59 PM. If you have any  questions, ask your nurse or doctor.        amLODipine 5 MG tablet Commonly known as: NORVASC Take 1 tablet (5 mg total) by mouth daily.   atorvastatin 10 MG tablet Commonly known as: LIPITOR Take 1 tablet (10 mg total) by mouth daily.   citalopram 20 MG tablet Commonly known as: CELEXA Half tablet daily for 10 days, then 1 tablet daily   Vitamin D (Ergocalciferol) 1.25 MG (50000 UT) Caps capsule Commonly known as: DRISDOL Take 1 capsule (50,000 Units total) by mouth every 7 (seven) days. For 12 weeks           Objective:   Physical Exam There were no vitals taken for this visit. This is a virtual video visit, alert oriented x3, no apparent distress    Assessment     ASSESSMENT HTN dx 09/2018 Anxiety Hyperlipidemia, + FH CAD LDL goal less < 130 Syncope 05-2007, refused w/u, no further sx  Vitamin D deficiency, DX 09-2018  PLAN HTN: Started  amlodipine few months ago, no side effects, no ambulatory BPs.  Recommend ambulatory BPs Hyperlipidemia: On Lipitor, last LDL not at goal, doing better with diet and exercise, recheck FLP Vitamin D deficiency: s/p ergocalciferol, currently in OTC supplements, checking levels. Plan: Schedule labs FLP, vitamin D and a BP check CPX 09-2019    I discussed the assessment and treatment plan with the patient. The patient was provided an opportunity to ask questions and all were answered. The patient agreed with the plan and demonstrated an understanding of the instructions.   The patient was advised to call back or seek an in-person evaluation if the symptoms worsen or if the condition fails to improve as anticipated.

## 2019-02-23 NOTE — Assessment & Plan Note (Signed)
HTN: Started amlodipine few months ago, no side effects, no ambulatory BPs.  Recommend ambulatory BPs Hyperlipidemia: On Lipitor, last LDL not at goal, doing better with diet and exercise, recheck FLP Vitamin D deficiency: s/p ergocalciferol, currently in OTC supplements, checking levels. Plan: Schedule labs FLP, vitamin D and a BP check CPX 09-2019

## 2019-03-03 ENCOUNTER — Encounter: Payer: Self-pay | Admitting: Internal Medicine

## 2019-03-04 ENCOUNTER — Other Ambulatory Visit: Payer: Self-pay

## 2019-03-04 MED ORDER — CITALOPRAM HYDROBROMIDE 20 MG PO TABS: 20.0000 mg | ORAL_TABLET | Freq: Every day | ORAL | 2 refills | Status: DC

## 2019-03-10 ENCOUNTER — Ambulatory Visit: Payer: Managed Care, Other (non HMO)

## 2019-03-10 ENCOUNTER — Other Ambulatory Visit: Payer: Managed Care, Other (non HMO)

## 2019-03-11 ENCOUNTER — Ambulatory Visit: Payer: Managed Care, Other (non HMO)

## 2019-05-06 ENCOUNTER — Other Ambulatory Visit: Payer: Self-pay

## 2019-05-06 ENCOUNTER — Other Ambulatory Visit (INDEPENDENT_AMBULATORY_CARE_PROVIDER_SITE_OTHER): Payer: Managed Care, Other (non HMO)

## 2019-05-06 ENCOUNTER — Ambulatory Visit (INDEPENDENT_AMBULATORY_CARE_PROVIDER_SITE_OTHER): Payer: Managed Care, Other (non HMO)

## 2019-05-06 VITALS — BP 122/78 | HR 78

## 2019-05-06 DIAGNOSIS — I10 Essential (primary) hypertension: Secondary | ICD-10-CM

## 2019-05-06 DIAGNOSIS — E782 Mixed hyperlipidemia: Secondary | ICD-10-CM

## 2019-05-06 DIAGNOSIS — E559 Vitamin D deficiency, unspecified: Secondary | ICD-10-CM

## 2019-05-06 LAB — LIPID PANEL
Cholesterol: 190 mg/dL (ref 0–200)
HDL: 66.6 mg/dL (ref >39.00)
LDL Cholesterol: 99 mg/dL (ref 0–99)
NonHDL: 123.27
Total CHOL/HDL Ratio: 3
Triglycerides: 123 mg/dL (ref 0.0–149.0)
VLDL: 24.6 mg/dL (ref 0.0–40.0)

## 2019-05-06 LAB — VITAMIN D 25 HYDROXY (VIT D DEFICIENCY, FRACTURES): VITD: 23.8 ng/mL — ABNORMAL LOW (ref 30.00–100.00)

## 2019-05-06 NOTE — Progress Notes (Addendum)
Pt here for Blood pressure check per PCP, virtual visit 02/22/2019.   Pt currently takes: amlodipine 5 mg daily   Pt reports compliance with medication.  BP today @ = 122/78 HR = 78   Advised patient will receive call with any changes.   Gerilyn Nestle, RN  Controlled  RF sent Kathlene November, MD

## 2019-05-07 ENCOUNTER — Encounter: Payer: Self-pay | Admitting: Internal Medicine

## 2019-05-07 MED ORDER — AMLODIPINE BESYLATE 5 MG PO TABS: 5.0000 mg | ORAL_TABLET | Freq: Every day | ORAL | 2 refills | Status: DC

## 2019-05-07 NOTE — Addendum Note (Signed)
Addended by: Kathlene November E on: 05/07/2019 03:20 PM   Modules accepted: Orders

## 2019-05-09 MED ORDER — VITAMIN D (ERGOCALCIFEROL) 1.25 MG (50000 UNIT) PO CAPS: 50000.0000 [IU] | ORAL_CAPSULE | ORAL | 0 refills | Status: DC

## 2019-05-09 NOTE — Addendum Note (Signed)
Addended byDamita Dunnings D on: 05/09/2019 07:42 AM   Modules accepted: Orders

## 2019-05-22 ENCOUNTER — Other Ambulatory Visit: Payer: Self-pay | Admitting: Internal Medicine

## 2019-05-23 MED ORDER — ATORVASTATIN CALCIUM 10 MG PO TABS: 10.0000 mg | ORAL_TABLET | Freq: Every day | ORAL | 0 refills | Status: DC

## 2019-08-18 ENCOUNTER — Other Ambulatory Visit: Payer: Self-pay

## 2019-08-18 MED ORDER — ATORVASTATIN CALCIUM 10 MG PO TABS: 10.0000 mg | ORAL_TABLET | Freq: Every day | ORAL | 1 refills | Status: DC

## 2019-10-18 ENCOUNTER — Encounter: Payer: Self-pay | Admitting: Internal Medicine

## 2019-10-18 ENCOUNTER — Other Ambulatory Visit: Payer: Self-pay

## 2019-10-18 ENCOUNTER — Ambulatory Visit (INDEPENDENT_AMBULATORY_CARE_PROVIDER_SITE_OTHER): Payer: Managed Care, Other (non HMO) | Admitting: Internal Medicine

## 2019-10-18 VITALS — BP 123/83 | HR 67 | Temp 97.9°F | Resp 16 | Ht 72.0 in | Wt 174.4 lb

## 2019-10-18 DIAGNOSIS — E559 Vitamin D deficiency, unspecified: Secondary | ICD-10-CM

## 2019-10-18 DIAGNOSIS — E782 Mixed hyperlipidemia: Secondary | ICD-10-CM

## 2019-10-18 DIAGNOSIS — Z0000 Encounter for general adult medical examination without abnormal findings: Secondary | ICD-10-CM

## 2019-10-18 LAB — CBC WITH DIFFERENTIAL/PLATELET
Basophils Absolute: 0.1 10*3/uL (ref 0.0–0.1)
Basophils Relative: 1.5 % (ref 0.0–3.0)
Eosinophils Absolute: 0.3 10*3/uL (ref 0.0–0.7)
Eosinophils Relative: 7.2 % — ABNORMAL HIGH (ref 0.0–5.0)
HCT: 38.3 % — ABNORMAL LOW (ref 39.0–52.0)
Hemoglobin: 13.3 g/dL (ref 13.0–17.0)
Lymphocytes Relative: 44.6 % (ref 12.0–46.0)
Lymphs Abs: 1.6 10*3/uL (ref 0.7–4.0)
MCHC: 34.7 g/dL (ref 30.0–36.0)
MCV: 97.3 fl (ref 78.0–100.0)
Monocytes Absolute: 0.5 10*3/uL (ref 0.1–1.0)
Monocytes Relative: 13 % — ABNORMAL HIGH (ref 3.0–12.0)
Neutro Abs: 1.2 10*3/uL — ABNORMAL LOW (ref 1.4–7.7)
Neutrophils Relative %: 33.7 % — ABNORMAL LOW (ref 43.0–77.0)
Platelets: 279 10*3/uL (ref 150.0–400.0)
RBC: 3.93 Mil/uL — ABNORMAL LOW (ref 4.22–5.81)
RDW: 13.1 % (ref 11.5–15.5)
WBC: 3.6 10*3/uL — ABNORMAL LOW (ref 4.0–10.5)

## 2019-10-18 LAB — COMPREHENSIVE METABOLIC PANEL
ALT: 10 U/L (ref 0–53)
AST: 14 U/L (ref 0–37)
Albumin: 4.4 g/dL (ref 3.5–5.2)
Alkaline Phosphatase: 58 U/L (ref 39–117)
BUN: 6 mg/dL (ref 6–23)
CO2: 31 mEq/L (ref 19–32)
Calcium: 9.2 mg/dL (ref 8.4–10.5)
Chloride: 104 mEq/L (ref 96–112)
Creatinine, Ser: 0.76 mg/dL (ref 0.40–1.50)
GFR: 105.67 mL/min (ref >60.00)
Glucose, Bld: 84 mg/dL (ref 70–99)
Potassium: 4.4 mEq/L (ref 3.5–5.1)
Sodium: 142 mEq/L (ref 135–145)
Total Bilirubin: 0.4 mg/dL (ref 0.2–1.2)
Total Protein: 6.8 g/dL (ref 6.0–8.3)

## 2019-10-18 LAB — LIPID PANEL
Cholesterol: 180 mg/dL (ref 0–200)
HDL: 70.4 mg/dL (ref >39.00)
LDL Cholesterol: 76 mg/dL (ref 0–99)
NonHDL: 109.8
Total CHOL/HDL Ratio: 3
Triglycerides: 170 mg/dL — ABNORMAL HIGH (ref 0.0–149.0)
VLDL: 34 mg/dL (ref 0.0–40.0)

## 2019-10-18 LAB — VITAMIN D 25 HYDROXY (VIT D DEFICIENCY, FRACTURES): VITD: 29.24 ng/mL — ABNORMAL LOW (ref 30.00–100.00)

## 2019-10-18 LAB — PSA: PSA: 1.72 ng/mL (ref 0.10–4.00)

## 2019-10-18 NOTE — Progress Notes (Signed)
   Subjective:    Patient ID: Matthew Bautista, male    DOB: 12/10/1960, 59 y.o.   MRN: [id number]  DOS:  10/18/2019 Type of visit - description: CPX Has no concerns, states is feeling great.  Review of Systems  A 14 point review of systems is negative    Past Medical History:  Diagnosis Date  . Anxiety   . Sebaceous cyst   . SYNCOPE 05-2007   refused w/u, no further sx     Past Surgical History:  Procedure Laterality Date  . NO PAST SURGERIES      Allergies as of 10/18/2019   No Known Allergies     Medication List       Accurate as of October 18, 2019 11:59 PM. If you have any questions, ask your nurse or doctor.        STOP taking these medications   Vitamin D (Ergocalciferol) 1.25 MG (50000 UNIT) Caps capsule Commonly known as: DRISDOL Stopped by: Kathlene November, MD     TAKE these medications   amLODipine 5 MG tablet Commonly known as: NORVASC Take 1 tablet (5 mg total) by mouth daily.   atorvastatin 10 MG tablet Commonly known as: LIPITOR Take 1 tablet (10 mg total) by mouth daily.   cholecalciferol 25 MCG (1000 UNIT) tablet Commonly known as: VITAMIN D3 Take 2,000 Units by mouth daily.   citalopram 20 MG tablet Commonly known as: CELEXA Take 1 tablet (20 mg total) by mouth daily.          Objective:   Physical Exam BP 123/83 (BP Location: Left Arm, Patient Position: Sitting, Cuff Size: Small)   Pulse 67   Temp 97.9 F (36.6 C) (Oral)   Resp 16   Ht 6' (1.829 m)   Wt 174 lb 6 oz (79.1 kg)   SpO2 96%   BMI 23.65 kg/m  General: Well developed, NAD, BMI noted Neck: No  thyromegaly  HEENT:  Normocephalic . Face symmetric, atraumatic Lungs:  CTA B Normal respiratory effort, no intercostal retractions, no accessory muscle use. Heart: RRR,  no murmur.  Abdomen:  Not distended, soft, non-tender. No rebound or rigidity.   Lower extremities: no pretibial edema bilaterally DRE: Normal sphincter tone, brown stools, prostate normal Skin: Exposed areas  without rash. Not pale. Not jaundice Neurologic:  alert & oriented X3.  Speech normal, gait appropriate for age and unassisted Strength symmetric and appropriate for age.  Psych: Cognition and judgment appear intact.  Cooperative with normal attention span and concentration.  Behavior appropriate. No anxious or depressed appearing.      Assessment    ASSESSMENT HTN dx 09/2018 Anxiety Hyperlipidemia, + FH CAD LDL goal less < 130 Syncope 05-2007, refused w/u, no further sx  Vitamin D deficiency, DX 09-2018  PLAN Here for CPX HTN: On amlodipine, BP is normal, recommend ambulatory BPs Anxiety, well controlled, RF citalopram as needed High cholesterol, started Lipitor, follow-up LDL 99, recheck FLP. Vitamin D deficiency: s/p  ergocalciferol , now on 2000 units OTC vitamin D.  Recheck levels. RTC 1 year   This visit occurred during the SARS-CoV-2 public health emergency.  Safety protocols were in place, including screening questions prior to the visit, additional usage of staff PPE, and extensive cleaning of exam room while observing appropriate contact time as indicated for disinfecting solutions.

## 2019-10-18 NOTE — Patient Instructions (Signed)
Check the  blood pressure monthly   BP GOAL is between 110/65 and  135/85. If it is consistently higher or lower, let me know  Please consider a flu shot this fall   GO TO THE LAB : Get the blood work     [place], [place] back for a physical exam in 1 year

## 2019-10-18 NOTE — Progress Notes (Signed)
Pre visit review using our clinic review tool, if applicable. No additional management support is needed unless otherwise documented below in the visit note. 

## 2019-10-19 ENCOUNTER — Encounter: Payer: Self-pay | Admitting: Internal Medicine

## 2019-10-19 NOTE — Assessment & Plan Note (Signed)
Here for CPX HTN: On amlodipine, BP is normal, recommend ambulatory BPs Anxiety, well controlled, RF citalopram as needed High cholesterol, started Lipitor, follow-up LDL 99, recheck FLP. Vitamin D deficiency: s/p  ergocalciferol , now on 2000 units OTC vitamin D.  Recheck levels. RTC 1 year

## 2019-10-19 NOTE — Assessment & Plan Note (Signed)
-  Td 07-2014 - s/p covid vaccine - rec - flu shot q year -  + FH CAD, goal LDL < 130, stress test (-) 2013.  Reluctant to take aspirin -- CCS: neg  cologuard 11/2018 -- Prostate cancer screening, DRE wnl, get a PSA, asx --Labs: CMP, FLP, CBC, PSA, vit D. --Diet, Exercise:  Counseled

## 2019-10-20 MED ORDER — VITAMIN D (ERGOCALCIFEROL) 1.25 MG (50000 UNIT) PO CAPS: 50000.0000 [IU] | ORAL_CAPSULE | ORAL | 0 refills | Status: DC

## 2019-10-20 NOTE — Addendum Note (Signed)
Addended byDamita Dunnings D on: 10/20/2019 02:18 PM   Modules accepted: Orders

## 2019-10-27 ENCOUNTER — Telehealth: Payer: Self-pay | Admitting: Internal Medicine

## 2019-10-27 NOTE — Telephone Encounter (Signed)
-----   Message from [place], [place] sent at 10/20/2019  2:18 PM EDT ----- Regarding: Lab appt Needs lab appt in 3 months. Orders in.   Thank you.

## 2019-10-27 NOTE — Telephone Encounter (Signed)
Left voice mail to schedule appointment 1st attempt 10/24/20 at 11:04am, 2nd attempt 10/27/19 443pm

## 2019-11-04 NOTE — Telephone Encounter (Signed)
Left voice mail to schedule appt

## 2019-12-12 ENCOUNTER — Other Ambulatory Visit: Payer: Self-pay

## 2019-12-12 MED ORDER — CITALOPRAM HYDROBROMIDE 20 MG PO TABS: 20.0000 mg | ORAL_TABLET | Freq: Every day | ORAL | 2 refills | Status: DC

## 2020-01-18 NOTE — Addendum Note (Signed)
Addended by: Kelle Darting A on: 01/18/2020 10:59 AM   Modules accepted: Orders

## 2020-01-20 ENCOUNTER — Other Ambulatory Visit (INDEPENDENT_AMBULATORY_CARE_PROVIDER_SITE_OTHER): Payer: Managed Care, Other (non HMO)

## 2020-01-20 ENCOUNTER — Other Ambulatory Visit: Payer: Self-pay

## 2020-01-20 DIAGNOSIS — E559 Vitamin D deficiency, unspecified: Secondary | ICD-10-CM

## 2020-01-21 LAB — VITAMIN D 25 HYDROXY (VIT D DEFICIENCY, FRACTURES): Vit D, 25-Hydroxy: 51 ng/mL (ref 30–100)

## 2020-01-22 ENCOUNTER — Other Ambulatory Visit: Payer: Self-pay | Admitting: Internal Medicine

## 2020-02-28 ENCOUNTER — Other Ambulatory Visit: Payer: Self-pay

## 2020-02-28 MED ORDER — AMLODIPINE BESYLATE 5 MG PO TABS
5.0000 mg | ORAL_TABLET | Freq: Every day | ORAL | 1 refills | Status: DC
Start: 2020-02-28 — End: 2020-08-28

## 2020-02-28 MED ORDER — ATORVASTATIN CALCIUM 10 MG PO TABS
10.0000 mg | ORAL_TABLET | Freq: Every day | ORAL | 1 refills | Status: DC
Start: 2020-02-28 — End: 2020-08-28

## 2020-03-07 ENCOUNTER — Encounter: Payer: Self-pay | Admitting: Internal Medicine

## 2020-08-28 ENCOUNTER — Other Ambulatory Visit: Payer: Self-pay

## 2020-08-28 MED ORDER — AMLODIPINE BESYLATE 5 MG PO TABS
5.0000 mg | ORAL_TABLET | Freq: Every day | ORAL | 1 refills | Status: DC
Start: 2020-08-28 — End: 2021-02-28

## 2020-08-28 MED ORDER — ATORVASTATIN CALCIUM 10 MG PO TABS
10.0000 mg | ORAL_TABLET | Freq: Every day | ORAL | 1 refills | Status: DC
Start: 2020-08-28 — End: 2021-02-28

## 2020-09-10 ENCOUNTER — Other Ambulatory Visit: Payer: Self-pay

## 2020-09-10 MED ORDER — CITALOPRAM HYDROBROMIDE 20 MG PO TABS
20.0000 mg | ORAL_TABLET | Freq: Every day | ORAL | 1 refills | Status: DC
Start: 2020-09-10 — End: 2021-02-28

## 2020-10-17 ENCOUNTER — Ambulatory Visit: Payer: Managed Care, Other (non HMO) | Admitting: Internal Medicine

## 2020-10-31 ENCOUNTER — Ambulatory Visit (INDEPENDENT_AMBULATORY_CARE_PROVIDER_SITE_OTHER): Payer: BC Managed Care – PPO | Admitting: Internal Medicine

## 2020-10-31 ENCOUNTER — Encounter: Payer: Self-pay | Admitting: Internal Medicine

## 2020-10-31 ENCOUNTER — Other Ambulatory Visit: Payer: Self-pay

## 2020-10-31 VITALS — BP 132/82 | HR 84 | Temp 97.8°F | Resp 16 | Ht 72.0 in | Wt 177.5 lb

## 2020-10-31 DIAGNOSIS — E559 Vitamin D deficiency, unspecified: Secondary | ICD-10-CM | POA: Diagnosis not present

## 2020-10-31 DIAGNOSIS — E782 Mixed hyperlipidemia: Secondary | ICD-10-CM | POA: Diagnosis not present

## 2020-10-31 DIAGNOSIS — Z0000 Encounter for general adult medical examination without abnormal findings: Secondary | ICD-10-CM | POA: Diagnosis not present

## 2020-10-31 LAB — LIPID PANEL
Cholesterol: 201 mg/dL — ABNORMAL HIGH (ref 0–200)
HDL: 70 mg/dL (ref >39.00)
LDL Cholesterol: 102 mg/dL — ABNORMAL HIGH (ref 0–99)
NonHDL: 131.09
Total CHOL/HDL Ratio: 3
Triglycerides: 145 mg/dL (ref 0.0–149.0)
VLDL: 29 mg/dL (ref 0.0–40.0)

## 2020-10-31 LAB — CBC WITH DIFFERENTIAL/PLATELET
Basophils Absolute: 0.1 10*3/uL (ref 0.0–0.1)
Basophils Relative: 1.3 % (ref 0.0–3.0)
Eosinophils Absolute: 0.2 10*3/uL (ref 0.0–0.7)
Eosinophils Relative: 5.6 % — ABNORMAL HIGH (ref 0.0–5.0)
HCT: 41.1 % (ref 39.0–52.0)
Hemoglobin: 14.1 g/dL (ref 13.0–17.0)
Lymphocytes Relative: 37.3 % (ref 12.0–46.0)
Lymphs Abs: 1.6 10*3/uL (ref 0.7–4.0)
MCHC: 34.2 g/dL (ref 30.0–36.0)
MCV: 95.9 fl (ref 78.0–100.0)
Monocytes Absolute: 0.6 10*3/uL (ref 0.1–1.0)
Monocytes Relative: 13.3 % — ABNORMAL HIGH (ref 3.0–12.0)
Neutro Abs: 1.8 10*3/uL (ref 1.4–7.7)
Neutrophils Relative %: 42.5 % — ABNORMAL LOW (ref 43.0–77.0)
Platelets: 279 10*3/uL (ref 150.0–400.0)
RBC: 4.29 Mil/uL (ref 4.22–5.81)
RDW: 12.8 % (ref 11.5–15.5)
WBC: 4.3 10*3/uL (ref 4.0–10.5)

## 2020-10-31 LAB — COMPREHENSIVE METABOLIC PANEL
ALT: 13 U/L (ref 0–53)
AST: 14 U/L (ref 0–37)
Albumin: 4.6 g/dL (ref 3.5–5.2)
Alkaline Phosphatase: 59 U/L (ref 39–117)
BUN: 7 mg/dL (ref 6–23)
CO2: 29 mEq/L (ref 19–32)
Calcium: 9.4 mg/dL (ref 8.4–10.5)
Chloride: 103 mEq/L (ref 96–112)
Creatinine, Ser: 0.72 mg/dL (ref 0.40–1.50)
GFR: 100.96 mL/min (ref >60.00)
Glucose, Bld: 81 mg/dL (ref 70–99)
Potassium: 4.4 mEq/L (ref 3.5–5.1)
Sodium: 141 mEq/L (ref 135–145)
Total Bilirubin: 0.5 mg/dL (ref 0.2–1.2)
Total Protein: 7.3 g/dL (ref 6.0–8.3)

## 2020-10-31 LAB — TSH: TSH: 1.46 u[IU]/mL (ref 0.35–5.50)

## 2020-10-31 LAB — VITAMIN D 25 HYDROXY (VIT D DEFICIENCY, FRACTURES): VITD: 34.12 ng/mL (ref 30.00–100.00)

## 2020-10-31 NOTE — Progress Notes (Signed)
Subjective:    Patient ID: Matthew Bautista, male    DOB: 10-22-1960, 60 y.o.   MRN: [id number]  DOS:  10/31/2020 Type of visit - description: CPX  Here for CPX, in general feels well. 2 years ago found a lump at the posterior neck, there is no change in his size, not tender. Also 2 weeks ago he sneezed and developed a pain is slightly upward right from the umbilicus.   Overall the area feels better now.   Review of Systems  Other than above, a 14 point review of systems is negative       Past Medical History:  Diagnosis Date   Anxiety    Sebaceous cyst    SYNCOPE 05-2007   refused w/u, no further sx     Past Surgical History:  Procedure Laterality Date   NO PAST SURGERIES     Family History  Problem Relation Age of Onset   Heart attack Father 67   Brain cancer Mother    Arrhythmia Neg Hx    Clotting disorder Neg Hx    Fainting Neg Hx    Hyperlipidemia Neg Hx    Hypertension Neg Hx    Colon cancer Neg Hx    Prostate cancer Neg Hx    Diabetes Neg Hx    Social History   Socioeconomic History   Marital status: Married    Spouse name: Not on file   Number of children: 1   Years of education: Not on file   Highest education level: Not on file  Occupational History   Occupation: Web designer: [employer]  Tobacco Use   Smoking status: Former    Types: Cigarettes    Quit date: 05/30/2007    Years since quitting: 13.4   Smokeless tobacco: Never   Tobacco comments:    Quit ~  2005  Substance and Sexual Activity   Alcohol use: Yes    Comment: socially   Drug use: No   Sexual activity: Not on file  Other Topics Concern   Not on file  Social History Narrative   Household -- pt , wife, son    son 10 y/o , finished  [employer], plans to be a Child psychotherapist of Radio broadcast assistant Strain: Not on file  Food Insecurity: Not on file  Transportation Needs: Not on file  Physical Activity: Not on file  Stress: Not on file   Social Connections: Not on file  Intimate Partner Violence: Not on file    Allergies as of 10/31/2020   No Known Allergies      Medication List        Accurate as of October 31, 2020  6:27 PM. If you have any questions, ask your nurse or doctor.          amLODipine 5 MG tablet Commonly known as: NORVASC Take 1 tablet (5 mg total) by mouth daily.   atorvastatin 10 MG tablet Commonly known as: LIPITOR Take 1 tablet (10 mg total) by mouth daily.   cholecalciferol 25 MCG (1000 UNIT) tablet Commonly known as: VITAMIN D3 Take 2,000 Units by mouth daily.   citalopram 20 MG tablet Commonly known as: CELEXA Take 1 tablet (20 mg total) by mouth daily.           Objective:   Physical Exam Neck:     BP 132/82 (BP Location: Left Arm, Patient Position: Sitting, Cuff Size: Small)   Pulse  84   Temp 97.8 F (36.6 C) (Oral)   Resp 16   Ht 6' (1.829 m)   Wt 177 lb 8 oz (80.5 kg)   SpO2 98%   BMI 24.07 kg/m  General: Well developed, NAD, BMI noted Neck: No  thyromegaly  HEENT:  Normocephalic . Face symmetric, atraumatic Lungs:  CTA B Normal respiratory effort, no intercostal retractions, no accessory muscle use. Heart: RRR,  no murmur.  Abdomen:  Not distended, soft, non-tender.  No umbilical hernia.  See HPI, at the area of pain 2 weeks ago there is no hernia or mass. Lower extremities: no pretibial edema bilaterally  Skin: Exposed areas without rash. Not pale. Not jaundice Neurologic:  alert & oriented X3.  Speech normal, gait appropriate for age and unassisted Strength symmetric and appropriate for age.  Psych: Cognition and judgment appear intact.  Cooperative with normal attention span and concentration.  Behavior appropriate. No anxious or depressed appearing.     Assessment     ASSESSMENT HTN dx 09/2018 Anxiety Hyperlipidemia, + FH CAD LDL goal less < 130 Syncope 05-2007, refused w/u, no further sx  Vitamin D deficiency, DX 09-2018  PLAN Here  for CPX HTN: On amlodipine, no ambulatory BPs, BP today is acceptable, recommend to start checking amb BPs.  Check labs High cholesterol: On Lipitor, labs Vitamin D deficiency: On OTCs.  Labs. Anxiety: Controlled on citalopram Sebaceous cyst: See physical exam, lump on the neck likely a sebaceous cyst.  Recommend observation Abdominal wall pain: See HPI.  Resolving.  No evidence of hernia at this point. RTC 1 year.   This visit occurred during the SARS-CoV-2 public health emergency.  Safety protocols were in place, including screening questions prior to the visit, additional usage of staff PPE, and extensive cleaning of exam room while observing appropriate contact time as indicated for disinfecting solutions.

## 2020-10-31 NOTE — Assessment & Plan Note (Signed)
-  Td 07-2014 - Covid vax J&J x1, benefits of a booster discussed, he is not inclined to take it.  He is also aware there is treatment for COVID infection if he ever gets positive. - shingrix d/w pt  - rec - flu shot q year -  + FH CAD, goal LDL < 130, stress test (-) 2013.  ASA?  Tried for a brief time, bleed excessively.  Not inclined to take it -- CCS: neg  cologuard 11/2018 -- Prostate cancer screening, DRE -PSA wnl 2021 --Labs: cmp, flp, cbc, tsh, vit d - lung Ca screening: no qualify, quit 2002, in addition, he is not interested  --Diet, Exercise:  Counseled  - POA: Discussed.

## 2020-10-31 NOTE — Assessment & Plan Note (Signed)
Here for CPX HTN: On amlodipine, no ambulatory BPs, BP today is acceptable, recommend to start checking amb BPs.  Check labs High cholesterol: On Lipitor, labs Vitamin D deficiency: On OTCs.  Labs. Anxiety: Controlled on citalopram Sebaceous cyst: See physical exam, lump on the neck likely a sebaceous cyst.  Recommend observation Abdominal wall pain: See HPI.  Resolving.  No evidence of hernia at this point. RTC 1 year.

## 2020-10-31 NOTE — Patient Instructions (Addendum)
Please consider getting your 2nd covid vaccine. You can do this today downstairs at our pharmacy if you like   Check the  blood pressure once or twice a month BP GOAL is between 110/65 and  135/85. If it is consistently higher or lower, let me know    GO TO THE LAB : Get the blood work     [place], [place] back for   a physical exam in 1 year     "Living will", "[place] of attorney": Advanced care planning  (If you already have a living will or healthcare power of attorney, please bring the copy to be scanned in your chart.)  Advance care planning is a process that supports adults in  understanding and sharing their preferences regarding future medical care.   The patient's preferences are recorded in documents called Advance Directives.    Advanced directives are completed (and can be modified at any time) while the patient is in full mental capacity.   The documentation should be available at all times to the patient, the family and the healthcare providers.  Bring in a copy to be scanned in your chart is an excellent idea and is recommended   This legal documents direct treatment decision making and/or appoint a surrogate to make the decision if the patient is not capable to do so.    Advance directives can be documented in many types of formats,  documents have names such as:  Lliving will  Durable power of attorney for healthcare (healthcare proxy or healthcare power of attorney)  Combined directives  Physician orders for life-sustaining treatment    More information at:  [email or web address]

## 2021-02-28 ENCOUNTER — Other Ambulatory Visit: Payer: Self-pay

## 2021-02-28 MED ORDER — ATORVASTATIN CALCIUM 10 MG PO TABS: 10.0000 mg | ORAL_TABLET | Freq: Every day | ORAL | 2 refills | Status: DC

## 2021-02-28 MED ORDER — AMLODIPINE BESYLATE 5 MG PO TABS: 5.0000 mg | ORAL_TABLET | Freq: Every day | ORAL | 2 refills | Status: DC

## 2021-02-28 MED ORDER — CITALOPRAM HYDROBROMIDE 20 MG PO TABS: 20.0000 mg | ORAL_TABLET | Freq: Every day | ORAL | 2 refills | Status: DC

## 2021-07-24 ENCOUNTER — Encounter: Payer: Self-pay | Admitting: Internal Medicine

## 2021-11-27 DIAGNOSIS — Z1211 Encounter for screening for malignant neoplasm of colon: Secondary | ICD-10-CM

## 2021-11-27 DIAGNOSIS — R195 Other fecal abnormalities: Secondary | ICD-10-CM

## 2021-11-28 ENCOUNTER — Other Ambulatory Visit: Payer: Self-pay

## 2021-11-28 MED ORDER — ATORVASTATIN CALCIUM 10 MG PO TABS: 10.0000 mg | ORAL_TABLET | Freq: Every day | ORAL | 1 refills | Status: DC

## 2021-11-28 MED ORDER — AMLODIPINE BESYLATE 5 MG PO TABS: 5.0000 mg | ORAL_TABLET | Freq: Every day | ORAL | 1 refills | Status: DC

## 2021-12-10 DIAGNOSIS — Z1211 Encounter for screening for malignant neoplasm of colon: Secondary | ICD-10-CM | POA: Diagnosis not present

## 2021-12-16 ENCOUNTER — Other Ambulatory Visit: Payer: Self-pay

## 2021-12-16 LAB — COLOGUARD: COLOGUARD: POSITIVE — AB

## 2021-12-16 MED ORDER — CITALOPRAM HYDROBROMIDE 20 MG PO TABS: 20.0000 mg | ORAL_TABLET | Freq: Every day | ORAL | 1 refills | Status: DC

## 2021-12-17 ENCOUNTER — Telehealth: Payer: Self-pay | Admitting: Internal Medicine

## 2021-12-17 DIAGNOSIS — E559 Vitamin D deficiency, unspecified: Secondary | ICD-10-CM

## 2021-12-17 DIAGNOSIS — E782 Mixed hyperlipidemia: Secondary | ICD-10-CM

## 2021-12-17 DIAGNOSIS — Z0000 Encounter for general adult medical examination without abnormal findings: Secondary | ICD-10-CM

## 2021-12-17 NOTE — Telephone Encounter (Signed)
Patient's physical rescheduled due to provider being out of the office. Please place order for labs to be done in advance of physical on 01/10/2022. Please advise when ready to schedule labs.

## 2021-12-17 NOTE — Telephone Encounter (Signed)
Okay to schedule few days before physical. CMP, FLP, CBC, PSA, vitamin D

## 2021-12-17 NOTE — Telephone Encounter (Signed)
Please advise 

## 2021-12-17 NOTE — Addendum Note (Signed)
Addended byDamita Dunnings D on: 12/17/2021 08:15 AM   Modules accepted: Orders

## 2021-12-18 NOTE — Telephone Encounter (Signed)
Orders have been placed. Please schedule lab appt several days prior to his cpx appt. Thank you.

## 2021-12-19 ENCOUNTER — Encounter: Payer: Self-pay | Admitting: Internal Medicine

## 2021-12-26 ENCOUNTER — Encounter: Payer: BC Managed Care – PPO | Admitting: Internal Medicine

## 2021-12-30 ENCOUNTER — Other Ambulatory Visit (INDEPENDENT_AMBULATORY_CARE_PROVIDER_SITE_OTHER): Payer: BC Managed Care – PPO

## 2021-12-30 DIAGNOSIS — E559 Vitamin D deficiency, unspecified: Secondary | ICD-10-CM

## 2021-12-30 DIAGNOSIS — E782 Mixed hyperlipidemia: Secondary | ICD-10-CM | POA: Diagnosis not present

## 2021-12-30 DIAGNOSIS — Z0000 Encounter for general adult medical examination without abnormal findings: Secondary | ICD-10-CM | POA: Diagnosis not present

## 2021-12-30 LAB — LIPID PANEL
Cholesterol: 183 mg/dL (ref 0–200)
HDL: 64 mg/dL (ref >39.00)
LDL Cholesterol: 84 mg/dL (ref 0–99)
NonHDL: 118.57
Total CHOL/HDL Ratio: 3
Triglycerides: 172 mg/dL — ABNORMAL HIGH (ref 0.0–149.0)
VLDL: 34.4 mg/dL (ref 0.0–40.0)

## 2021-12-30 LAB — COMPREHENSIVE METABOLIC PANEL
ALT: 13 U/L (ref 0–53)
AST: 16 U/L (ref 0–37)
Albumin: 4.4 g/dL (ref 3.5–5.2)
Alkaline Phosphatase: 66 U/L (ref 39–117)
BUN: 10 mg/dL (ref 6–23)
CO2: 27 mEq/L (ref 19–32)
Calcium: 9.1 mg/dL (ref 8.4–10.5)
Chloride: 102 mEq/L (ref 96–112)
Creatinine, Ser: 0.81 mg/dL (ref 0.40–1.50)
GFR: 96.64 mL/min (ref >60.00)
Glucose, Bld: 96 mg/dL (ref 70–99)
Potassium: 3.8 mEq/L (ref 3.5–5.1)
Sodium: 140 mEq/L (ref 135–145)
Total Bilirubin: 0.9 mg/dL (ref 0.2–1.2)
Total Protein: 6.9 g/dL (ref 6.0–8.3)

## 2021-12-30 LAB — CBC WITH DIFFERENTIAL/PLATELET
Basophils Absolute: 0.1 10*3/uL (ref 0.0–0.1)
Basophils Relative: 1.2 % (ref 0.0–3.0)
Eosinophils Absolute: 0.3 10*3/uL (ref 0.0–0.7)
Eosinophils Relative: 6.1 % — ABNORMAL HIGH (ref 0.0–5.0)
HCT: 39.5 % (ref 39.0–52.0)
Hemoglobin: 14.2 g/dL (ref 13.0–17.0)
Lymphocytes Relative: 44.7 % (ref 12.0–46.0)
Lymphs Abs: 2.4 10*3/uL (ref 0.7–4.0)
MCHC: 36 g/dL (ref 30.0–36.0)
MCV: 93.3 fl (ref 78.0–100.0)
Monocytes Absolute: 0.7 10*3/uL (ref 0.1–1.0)
Monocytes Relative: 12.5 % — ABNORMAL HIGH (ref 3.0–12.0)
Neutro Abs: 1.9 10*3/uL (ref 1.4–7.7)
Neutrophils Relative %: 35.5 % — ABNORMAL LOW (ref 43.0–77.0)
Platelets: 252 10*3/uL (ref 150.0–400.0)
RBC: 4.24 Mil/uL (ref 4.22–5.81)
RDW: 13 % (ref 11.5–15.5)
WBC: 5.4 10*3/uL (ref 4.0–10.5)

## 2021-12-30 LAB — PSA: PSA: 1.63 ng/mL (ref 0.10–4.00)

## 2021-12-30 LAB — VITAMIN D 25 HYDROXY (VIT D DEFICIENCY, FRACTURES): VITD: 44.4 ng/mL (ref 30.00–100.00)

## 2022-01-10 ENCOUNTER — Ambulatory Visit (INDEPENDENT_AMBULATORY_CARE_PROVIDER_SITE_OTHER): Payer: BC Managed Care – PPO | Admitting: Internal Medicine

## 2022-01-10 ENCOUNTER — Encounter: Payer: Self-pay | Admitting: Internal Medicine

## 2022-01-10 VITALS — BP 130/84 | HR 80 | Temp 98.4°F | Resp 16 | Ht 72.0 in | Wt 184.2 lb

## 2022-01-10 DIAGNOSIS — Z0000 Encounter for general adult medical examination without abnormal findings: Secondary | ICD-10-CM

## 2022-01-10 NOTE — Patient Instructions (Addendum)
Check the  blood pressure regularly BP GOAL is between 110/65 and  135/85. If it is consistently higher or lower, let me know    [place], [place] back for a physical exam in 1 year     Advanced care planning:  Do you have a "Living will", "[place] of attorney" ?   If you already have a living will or healthcare power of attorney, is recommended you bring the copy to be scanned in your chart. The document will be available to all the doctors you see in the system.  If you don't have one, please consider create one.  Advance care planning is a process that supports adults in  understanding and sharing their preferences regarding future medical care.   The patient's preferences are recorded in documents called Advance Directives.    Advanced directives are completed (and can be modified at any time) while the patient is in full mental capacity.   The documentation should be available at all times to the patient, the family and the healthcare providers.   This legal documents direct treatment decision making and/or appoint a surrogate to make the decision if the patient is not capable to do so.    Advance directives can be documented in many types of formats,  documents have names such as:  Lliving will  Durable power of attorney for healthcare (healthcare proxy or healthcare power of attorney)  Combined directives  Physician orders for life-sustaining treatment    More information at:  [email or web address]

## 2022-01-10 NOTE — Progress Notes (Unsigned)
Subjective:    Patient ID: Matthew Bautista, male    DOB: 01/23/1961, 61 y.o.   MRN: [id number]  DOS:  01/10/2022 Type of visit - description: CPX  Here for CPX Doing great. Has no concerns. He specifically denies chest pain, difficulty breathing.  No GI symptoms.  Review of Systems See above   Past Medical History:  Diagnosis Date   Anxiety    Sebaceous cyst    SYNCOPE 05-2007   refused w/u, no further sx     Past Surgical History:  Procedure Laterality Date   NO PAST SURGERIES      Current Outpatient Medications  Medication Instructions   amLODipine (NORVASC) 5 mg, Oral, Daily   Ascorbic Acid (SUPER C COMPLEX PO) Oral   atorvastatin (LIPITOR) 10 mg, Oral, Daily   B Complex Vitamins (B COMPLEX PO) Oral   cholecalciferol (VITAMIN D3) 2,000 Units, Oral, Daily   citalopram (CELEXA) 20 mg, Oral, Daily       Objective:   Physical Exam BP 130/84   Pulse 80   Temp 98.4 F (36.9 C) (Oral)   Resp 16   Ht 6' (1.829 m)   Wt 184 lb 4 oz (83.6 kg)   SpO2 98%   BMI 24.99 kg/m  General: Well developed, NAD, BMI noted Neck: No  thyromegaly  HEENT:  Normocephalic . Face symmetric, atraumatic Lungs:  CTA B Normal respiratory effort, no intercostal retractions, no accessory muscle use. Heart: RRR,  no murmur.  Abdomen:  Not distended, soft, non-tender. No rebound or rigidity.   Lower extremities: no pretibial edema bilaterally  Skin: Exposed areas without rash. Not pale. Not jaundice Neurologic:  alert & oriented X3.  Speech normal, gait appropriate for age and unassisted Strength symmetric and appropriate for age.  Psych: Cognition and judgment appear intact.  Cooperative with normal attention span and concentration.  Behavior appropriate. No anxious or depressed appearing.     Assessment     ASSESSMENT HTN dx 09/2018 Anxiety Hyperlipidemia, + FH CAD  Syncope 05-2007, refused w/u, no further sx  Vitamin D deficiency, DX 09-2018  PLAN Here for CPX HTN: BP  is great today, last BMP normal, continue amlodipine. High cholesterol: + FH CAD, on Lipitor 10 mg,Cholesterol few days ago is great, CV RF is 7.3%.  No change Vitamin D deficiency: On supplements, last vitamin D normal. RTC 1 year   -Td 07-2014 - Covid vax recommended, reluctant - shingrix d/w pt  -Flu shot recommended, reluctant to proceed -  + FH CAD, stress test (-) 2013.  ASA?  Tried for a brief time, bleed excessively and not inclined to retry. -- CCS: neg  cologuard 11/2018; (+) Cologuard 12-10-21 Patient had to cancel a colonoscopy, he told me he understands how important is to proceed and plans to do so as soon as he can. -- Prostate cancer screening: Recent PSA normal, no symptoms, no family history.  DRE deferred. --Labs: Recently done and reviewed - lung Ca screening: Does not qualify, in addition he is not interested. --Diet, Exercise: Doing great. - POA: Discussed.   The 10-year ASCVD risk score (Arnett DK, et al., 2019) is: 7.3%   Values used to calculate the score:     Age: 83 years     Sex: Male     Is Non-Hispanic African American: No     Diabetic: No     Tobacco smoker: No     Systolic Blood Pressure: 329 mmHg     Is BP treated:  Yes     HDL Cholesterol: 64 mg/dL     Total Cholesterol: 183 mg/dL  === HTN: On amlodipine, no ambulatory BPs, BP today is acceptable, recommend to start checking amb BPs.  Check labs High cholesterol: On Lipitor, labs Vitamin D deficiency: On OTCs.  Labs. Anxiety: Controlled on citalopram Sebaceous cyst: See physical exam, lump on the neck likely a sebaceous cyst.  Recommend observation Abdominal wall pain: See HPI.  Resolving.  No evidence of hernia at this point. RTC 1 year.

## 2022-01-12 ENCOUNTER — Encounter: Payer: Self-pay | Admitting: Internal Medicine

## 2022-01-12 NOTE — Assessment & Plan Note (Signed)
Here for CPX HTN: BP is great today, last BMP normal, continue amlodipine. High cholesterol: + FH CAD, on Lipitor 10 mg,Cholesterol few days ago is great, CV RF is 7.3%.  No change Vitamin D deficiency: On supplements, last vitamin D normal. RTC 1 year

## 2022-01-12 NOTE — Assessment & Plan Note (Signed)
-  Td 07-2014 - Covid vax recommended, pt is reluctant - shingrix d/w pt  -Flu shot recommended, reluctant to proceed -  + FH CAD, stress test (-) 2013.  ASA?  Tried for a brief time, bleed excessively and not inclined to retry. -- CCS: neg  cologuard 11/2018; (+) Cologuard 12-10-21 Patient had to cancel a colonoscopy, he told me he understands how important is to proceed and plans to do so as soon as he can. -- Prostate cancer screening: Recent PSA normal, no symptoms, no family history.  DRE deferred. --Labs: Recently done, reviewed - lung Ca screening: Does not qualify  --Diet, Exercise: Doing great. - POA: Discussed.

## 2022-01-29 ENCOUNTER — Encounter: Payer: BC Managed Care – PPO | Admitting: Internal Medicine

## 2022-03-07 ENCOUNTER — Encounter: Payer: Self-pay | Admitting: Gastroenterology

## 2022-04-08 ENCOUNTER — Ambulatory Visit (AMBULATORY_SURGERY_CENTER): Payer: BC Managed Care – PPO | Admitting: *Deleted

## 2022-04-08 ENCOUNTER — Telehealth: Payer: Self-pay | Admitting: Gastroenterology

## 2022-04-08 VITALS — Ht 72.0 in | Wt 175.0 lb

## 2022-04-08 DIAGNOSIS — R195 Other fecal abnormalities: Secondary | ICD-10-CM

## 2022-04-08 MED ORDER — NA SULFATE-K SULFATE-MG SULF 17.5-3.13-1.6 GM/177ML PO SOLN: 1.0000 | Freq: Once | ORAL | 0 refills | Status: AC

## 2022-04-08 NOTE — Telephone Encounter (Signed)
Spoke with pt and told him ok to proceed as scheduled

## 2022-04-08 NOTE — Progress Notes (Signed)
Pt's previsit is done over the phone and all paperwork (prep instructions) sent to patient.  Pt's name and DOB verified at the beginning of the previsit.  Pt denies any difficulty with ambulating.   No egg or soy allergy known to patient  Pt denies trouble moving neck No FH of Malignant Hyperthermia Pt is not on diet pills Pt is not on  home 02  Pt is not on blood thinners  Pt denies issues with constipation  Pt is not on dialysis Pt denies any upcoming cardiac testing Pt encouraged to use to use Singlecare or Goodrx to reduce cost  Patient's chart reviewed by Osvaldo Angst CNRA prior to previsit and patient appropriate for the [facility].  Previsit completed and red dot placed by patient's name on their procedure day (on provider's schedule).

## 2022-04-08 NOTE — Telephone Encounter (Signed)
Patient called, stated he had a Pre-visit appointment today. During his visit, he forgot to ask if it was safe for him to have his procedure after a dentist appointment this Thursday. Patient states they will be using Novacain, and he is wondering if it is safe to proceed.

## 2022-04-10 ENCOUNTER — Encounter: Payer: Self-pay | Admitting: Gastroenterology

## 2022-04-18 ENCOUNTER — Ambulatory Visit (AMBULATORY_SURGERY_CENTER): Payer: BC Managed Care – PPO | Admitting: Gastroenterology

## 2022-04-18 ENCOUNTER — Encounter: Payer: Self-pay | Admitting: Gastroenterology

## 2022-04-18 VITALS — BP 132/87 | HR 69 | Temp 98.4°F | Resp 16 | Ht 72.0 in | Wt 175.0 lb

## 2022-04-18 DIAGNOSIS — D123 Benign neoplasm of transverse colon: Secondary | ICD-10-CM

## 2022-04-18 DIAGNOSIS — R195 Other fecal abnormalities: Secondary | ICD-10-CM

## 2022-04-18 DIAGNOSIS — D124 Benign neoplasm of descending colon: Secondary | ICD-10-CM | POA: Diagnosis not present

## 2022-04-18 DIAGNOSIS — Z1211 Encounter for screening for malignant neoplasm of colon: Secondary | ICD-10-CM

## 2022-04-18 HISTORY — PX: COLONOSCOPY: SHX174

## 2022-04-18 MED ORDER — SODIUM CHLORIDE 0.9 % IV SOLN: 500.0000 mL | Freq: Once | INTRAVENOUS | Status: DC

## 2022-04-18 NOTE — Progress Notes (Signed)
Pt's states no medical or surgical changes since previsit or office visit. 

## 2022-04-18 NOTE — Progress Notes (Signed)
To pacu, vss. Report to [email or web address] 

## 2022-04-18 NOTE — Progress Notes (Signed)
   Referring Provider: Colon Branch, MD Primary Care Physician:  Colon Branch, MD  Indication for Colonoscopy:  Positive Cologuard   IMPRESSION:  Positive Cologuard Appropriate candidate for monitored anesthesia care  PLAN: Colonoscopy in the [facility] today   HPI: Matthew Bautista is a 62 y.o. male presents for colonoscopy to evaluate a positive Cologuard.  No prior colonoscopy or colon cancer screening.  No known family history of colon cancer or polyps. No family history of uterine/endometrial cancer, pancreatic cancer or gastric/stomach cancer.   Past Medical History:  Diagnosis Date   Anxiety    Chronic kidney disease    kidney stones   Hyperlipidemia    Hypertension    Sebaceous cyst    SYNCOPE 05/30/2007   refused w/u, no further sx     Past Surgical History:  Procedure Laterality Date   COLONOSCOPY  04/18/2022   NO PAST SURGERIES      Current Outpatient Medications  Medication Sig Dispense Refill   amLODipine (NORVASC) 5 MG tablet Take 1 tablet (5 mg total) by mouth daily. 90 tablet 1   Ascorbic Acid (SUPER C COMPLEX PO) Take by mouth.     atorvastatin (LIPITOR) 10 MG tablet Take 1 tablet (10 mg total) by mouth daily. 90 tablet 1   B Complex Vitamins (B COMPLEX PO) Take by mouth.     cholecalciferol (VITAMIN D3) 25 MCG (1000 UNIT) tablet Take 2,000 Units by mouth daily.     citalopram (CELEXA) 20 MG tablet Take 1 tablet (20 mg total) by mouth daily. 90 tablet 1   Coenzyme Q10 (COQ10) 100 MG CAPS 100 mg daily.     Current Facility-Administered Medications  Medication Dose Route Frequency Provider Last Rate Last Admin   0.9 %  sodium chloride infusion  500 mL Intravenous Once [place], MD        Allergies as of 04/18/2022   (No Known Allergies)    Family History  Problem Relation Age of Onset   Brain cancer Mother    Heart attack Father 54   Arrhythmia Neg Hx    Clotting disorder Neg Hx    Fainting Neg Hx    Hyperlipidemia Neg Hx    Hypertension  Neg Hx    Colon cancer Neg Hx    Prostate cancer Neg Hx    Diabetes Neg Hx    Stomach cancer Neg Hx    Rectal cancer Neg Hx      Physical Exam: General:   Alert,  well-nourished, pleasant and cooperative in NAD Head:  Normocephalic and atraumatic. Eyes:  Sclera clear, no icterus.   Conjunctiva pink. Mouth:  No deformity or lesions.   Neck:  Supple; no masses or thyromegaly. Lungs:  Clear throughout to auscultation.   No wheezes. Heart:  Regular rate and rhythm; no murmurs. Abdomen:  Soft, non-tender, nondistended, normal bowel sounds, no rebound or guarding.  Msk:  Symmetrical. No boney deformities LAD: No inguinal or umbilical LAD Extremities:  No clubbing or edema. Neurologic:  Alert and  oriented x4;  grossly nonfocal Skin:  No obvious rash or bruise. Psych:  Alert and cooperative. Normal mood and affect.     Studies/Results: No results found.    [name], MD, MPH 04/18/2022, 1:20 PM

## 2022-04-18 NOTE — Progress Notes (Signed)
Called to room to assist during endoscopic procedure.  Patient ID and intended procedure confirmed with present staff. Received instructions for my participation in the procedure from the performing physician.

## 2022-04-18 NOTE — Patient Instructions (Signed)
Please read handouts provided. Continue present medications. Await pathology results.   YOU HAD AN ENDOSCOPIC PROCEDURE TODAY AT THE [facility]:   Refer to the procedure report that was given to you for any specific questions about what was found during the examination.  If the procedure report does not answer your questions, please call your gastroenterologist to clarify.  If you requested that your care partner not be given the details of your procedure findings, then the procedure report has been included in a sealed envelope for you to review at your convenience later.  YOU SHOULD EXPECT: Some feelings of bloating in the abdomen. Passage of more gas than usual.  Walking can help get rid of the air that was put into your GI tract during the procedure and reduce the bloating. If you had a lower endoscopy (such as a colonoscopy or flexible sigmoidoscopy) you may notice spotting of blood in your stool or on the toilet paper. If you underwent a bowel prep for your procedure, you may not have a normal bowel movement for a few days.  Please Note:  You might notice some irritation and congestion in your nose or some drainage.  This is from the oxygen used during your procedure.  There is no need for concern and it should clear up in a day or so.  SYMPTOMS TO REPORT IMMEDIATELY:  Following lower endoscopy (colonoscopy or flexible sigmoidoscopy):  Excessive amounts of blood in the stool  Significant tenderness or worsening of abdominal pains  Swelling of the abdomen that is new, acute  Fever of 100F or higher  For urgent or emergent issues, a gastroenterologist can be reached at any hour by calling [phone]. Do not use MyChart messaging for urgent concerns.    DIET:  We do recommend a small meal at first, but then you may proceed to your regular diet.  Drink plenty of fluids but you should avoid alcoholic beverages for 24 hours.  ACTIVITY:  You should plan to take it easy for  the rest of today and you should NOT DRIVE or use heavy machinery until tomorrow (because of the sedation medicines used during the test).    FOLLOW UP: Our staff will call the number listed on your records the next business day following your procedure.  We will call around 7:15- 8:00 am to check on you and address any questions or concerns that you may have regarding the information given to you following your procedure. If we do not reach you, we will leave a message.     If any biopsies were taken you will be contacted by phone or by letter within the next 1-3 weeks.  Please call us at [phone] if you have not heard about the biopsies in 3 weeks.    SIGNATURES/CONFIDENTIALITY: You and/or your care partner have signed paperwork which will be entered into your electronic medical record.  These signatures attest to the fact that that the information above on your After Visit Summary has been reviewed and is understood.  Full responsibility of the confidentiality of this discharge information lies with you and/or your care-partner. 

## 2022-04-18 NOTE — Op Note (Signed)
[facility] Patient Name: Bladen Umar Procedure Date: 04/18/2022 1:16 PM MRN: [id number] Endoscopist: Thornton Park MD, MD, [id number] Age: 62 Referring MD:  Date of Birth: [birth year] Gender: Male Account #: [id number] Procedure:                Colonoscopy Indications:              Screening for colorectal malignant neoplasm, This                            is the patient's first colonoscopy                           No known family history of colon cancer or polyps Medicines:                Monitored Anesthesia Care Procedure:                Pre-Anesthesia Assessment:                           - Prior to the procedure, a History and Physical                            was performed, and patient medications and                            allergies were reviewed. The patient's tolerance of                            previous anesthesia was also reviewed. The risks                            and benefits of the procedure and the sedation                            options and risks were discussed with the patient.                            All questions were answered, and informed consent                            was obtained. Prior Anticoagulants: The patient has                            taken no anticoagulant or antiplatelet agents. ASA                            Grade Assessment: II - A patient with mild systemic                            disease. After reviewing the risks and benefits,                            the patient was deemed in satisfactory condition to  undergo the procedure.                           After obtaining informed consent, the colonoscope                            was passed under direct vision. Throughout the                            procedure, the patient's blood pressure, pulse, and                            oxygen saturations were monitored continuously. The                            CF [id number] #[id number] was  introduced through the anus                            and advanced to the 3 cm into the ileum. A second                            forward view of the right colon was performed. The                            colonoscopy was performed without difficulty. The                            patient tolerated the procedure well. The quality                            of the bowel preparation was good. The terminal                            ileum, ileocecal valve, appendiceal orifice, and                            rectum were photographed. Scope In: 1:48:16 PM Scope Out: 2:10:27 PM Scope Withdrawal Time: 0 hours 18 minutes 7 seconds  Total Procedure Duration: 0 hours 22 minutes 11 seconds  Findings:                 The perianal and digital rectal examinations were                            normal.                           A 3 mm polyp was found in the descending colon. The                            polyp was sessile. The polyp was removed with a                            cold snare. Resection and retrieval were complete.  Estimated blood loss was minimal.                           A 2 mm polyp was found in the hepatic flexure. The                            polyp was sessile. The polyp was removed with a                            cold snare. Resection and retrieval were complete.                            Estimated blood loss was minimal.                           The exam was otherwise without abnormality on                            direct and retroflexion views. Complications:            No immediate complications. Estimated Blood Loss:     Estimated blood loss was minimal. Impression:               - One 3 mm polyp in the descending colon, removed                            with a cold snare. Resected and retrieved.                           - One 2 mm polyp at the hepatic flexure, removed                            with a cold snare. Resected and  retrieved.                           - The examination was otherwise normal on direct                            and retroflexion views. Recommendation:           - Patient has a contact number available for                            emergencies. The signs and symptoms of potential                            delayed complications were discussed with the                            patient. Return to normal activities tomorrow.                            Written discharge instructions were provided to the  patient.                           - Resume previous diet.                           - Continue present medications.                           - Await pathology results.                           - Repeat colonoscopy date to be determined after                            pending pathology results are reviewed for                            surveillance.                           - Emerging evidence supports eating a diet of                            fruits, vegetables, grains, calcium, and yogurt                            while reducing red meat and alcohol may reduce the                            risk of colon cancer.                           - Thank you for allowing me to be involved in your                            colon cancer prevention. Thornton Park MD, MD 04/18/2022 2:15:59 PM This report has been signed electronically.

## 2022-04-21 ENCOUNTER — Telehealth: Payer: Self-pay | Admitting: *Deleted

## 2022-04-21 NOTE — Telephone Encounter (Signed)
  Follow up Call-    Row Labels 04/18/2022   12:28 PM  Call back number   Section Header. No data exists in this row.   Post procedure Call Back phone  #   [phone]  Permission to leave phone message   Yes     Patient questions:  Do you have a fever, pain , or abdominal swelling? No. Pain Score  0 *  Have you tolerated food without any problems? Yes.    Have you been able to return to your normal activities? Yes.    Do you have any questions about your discharge instructions: Diet   No. Medications  No. Follow up visit  No.  Do you have questions or concerns about your Care? No.  Actions: * If pain score is 4 or above: No action needed, pain <4.

## 2022-04-21 NOTE — Telephone Encounter (Signed)
  Follow up Call-    Row Labels 04/18/2022   12:28 PM  Call back number   Section Header. No data exists in this row.   Post procedure Call Back phone  #   [phone]  Permission to leave phone message   Yes     Patient questions:  Do you have a fever, pain , or abdominal swelling? No. Pain Score  0 *  Have you tolerated food without any problems? Yes.    Have you been able to return to your normal activities? Yes.    Do you have any questions about your discharge instructions: Diet   No. Medications  No. Follow up visit  No.  Do you have questions or concerns about your Care? No.  Actions: * If pain score is 4 or above: No action needed, pain <4.

## 2022-04-29 ENCOUNTER — Encounter: Payer: Self-pay | Admitting: Gastroenterology

## 2022-06-04 ENCOUNTER — Other Ambulatory Visit: Payer: Self-pay

## 2022-06-04 MED ORDER — CITALOPRAM HYDROBROMIDE 20 MG PO TABS: 20.0000 mg | ORAL_TABLET | Freq: Every day | ORAL | 2 refills | Status: DC

## 2022-06-06 ENCOUNTER — Other Ambulatory Visit: Payer: Self-pay

## 2022-06-06 MED ORDER — ATORVASTATIN CALCIUM 10 MG PO TABS: 10.0000 mg | ORAL_TABLET | Freq: Every day | ORAL | 2 refills | Status: DC

## 2022-06-06 MED ORDER — AMLODIPINE BESYLATE 5 MG PO TABS: 5.0000 mg | ORAL_TABLET | Freq: Every day | ORAL | 2 refills | Status: DC

## 2023-01-13 ENCOUNTER — Encounter: Payer: Self-pay | Admitting: Internal Medicine

## 2023-01-13 ENCOUNTER — Ambulatory Visit: Payer: BC Managed Care – PPO | Admitting: Internal Medicine

## 2023-01-13 VITALS — BP 126/82 | HR 70 | Temp 97.8°F | Resp 16 | Ht 72.0 in | Wt 186.0 lb

## 2023-01-13 DIAGNOSIS — Z0000 Encounter for general adult medical examination without abnormal findings: Secondary | ICD-10-CM

## 2023-01-13 DIAGNOSIS — E782 Mixed hyperlipidemia: Secondary | ICD-10-CM

## 2023-01-13 LAB — BASIC METABOLIC PANEL
BUN: 9 mg/dL (ref 6–23)
CO2: 29 meq/L (ref 19–32)
Calcium: 9.1 mg/dL (ref 8.4–10.5)
Chloride: 107 meq/L (ref 96–112)
Creatinine, Ser: 0.8 mg/dL (ref 0.40–1.50)
GFR: 96.3 mL/min (ref >60.00)
Glucose, Bld: 96 mg/dL (ref 70–99)
Potassium: 4.5 meq/L (ref 3.5–5.1)
Sodium: 142 meq/L (ref 135–145)

## 2023-01-13 LAB — LIPID PANEL
Cholesterol: 190 mg/dL (ref 0–200)
HDL: 68.7 mg/dL (ref >39.00)
LDL Cholesterol: 98 mg/dL (ref 0–99)
NonHDL: 121.56
Total CHOL/HDL Ratio: 3
Triglycerides: 117 mg/dL (ref 0.0–149.0)
VLDL: 23.4 mg/dL (ref 0.0–40.0)

## 2023-01-13 LAB — CBC WITH DIFFERENTIAL/PLATELET
Basophils Absolute: 0 10*3/uL (ref 0.0–0.1)
Basophils Relative: 1.1 % (ref 0.0–3.0)
Eosinophils Absolute: 0.3 10*3/uL (ref 0.0–0.7)
Eosinophils Relative: 6.7 % — ABNORMAL HIGH (ref 0.0–5.0)
HCT: 40.9 % (ref 39.0–52.0)
Hemoglobin: 13.9 g/dL (ref 13.0–17.0)
Lymphocytes Relative: 41.7 % (ref 12.0–46.0)
Lymphs Abs: 1.8 10*3/uL (ref 0.7–4.0)
MCHC: 34.1 g/dL (ref 30.0–36.0)
MCV: 96.3 fL (ref 78.0–100.0)
Monocytes Absolute: 0.6 10*3/uL (ref 0.1–1.0)
Monocytes Relative: 13.5 % — ABNORMAL HIGH (ref 3.0–12.0)
Neutro Abs: 1.6 10*3/uL (ref 1.4–7.7)
Neutrophils Relative %: 37 % — ABNORMAL LOW (ref 43.0–77.0)
Platelets: 243 10*3/uL (ref 150.0–400.0)
RBC: 4.25 Mil/uL (ref 4.22–5.81)
RDW: 13.3 % (ref 11.5–15.5)
WBC: 4.3 10*3/uL (ref 4.0–10.5)

## 2023-01-13 LAB — ALT: ALT: 13 U/L (ref 0–53)

## 2023-01-13 LAB — AST: AST: 14 U/L (ref 0–37)

## 2023-01-13 LAB — PSA: PSA: 1.56 ng/mL (ref 0.10–4.00)

## 2023-01-13 NOTE — Progress Notes (Signed)
Subjective:    Patient ID: Matthew Bautista, male    DOB: Aug 08, 1960, 62 y.o.   MRN: [id number]  DOS:  01/13/2023 Type of visit - description: cpx  Doing well, had no concerns.   Review of Systems  A 14 point review of systems is negative    Past Medical History:  Diagnosis Date   Anxiety    Chronic kidney disease    kidney stones   Hyperlipidemia    Hypertension    Sebaceous cyst    SYNCOPE 05/30/2007   refused w/u, no further sx     Past Surgical History:  Procedure Laterality Date   COLONOSCOPY  04/18/2022   NO PAST SURGERIES     Social History   Socioeconomic History   Marital status: Married    Spouse name: Not on file   Number of children: 1   Years of education: Not on file   Highest education level: Not on file  Occupational History   Occupation: Health visitor: [employer]  Tobacco Use   Smoking status: Former    Current packs/day: 0.00    Types: Cigarettes    Quit date: 05/30/2007    Years since quitting: 15.6   Smokeless tobacco: Never   Tobacco comments:    Quit ~  2005  Vaping Use   Vaping status: Never Used  Substance and Sexual Activity   Alcohol use: Yes    Comment: socially   Drug use: No   Sexual activity: Yes  Other Topics Concern   Not on file  Social History Narrative   Household -- pt , wife    Son 76 y/o , finished  Field seismologist, is a Development worker, community of Corporate investment banker Strain: Not on Ship broker Insecurity: Not on file  Transportation Needs: Not on file  Physical Activity: Not on file  Stress: Not on file  Social Connections: Not on file  Intimate Partner Violence: Not on file    Current Outpatient Medications  Medication Instructions   amLODipine (NORVASC) 5 mg, Oral, Daily   Ascorbic Acid (SUPER C COMPLEX PO) Oral   atorvastatin (LIPITOR) 10 mg, Oral, Daily   B Complex Vitamins (B COMPLEX PO) Oral   cholecalciferol (VITAMIN D3) 2,000 Units, Oral, Daily   citalopram (CELEXA) 20  mg, Oral, Daily   CoQ10 100 mg, Daily       Objective:   Physical Exam BP 126/82   Pulse 70   Temp 97.8 F (36.6 C) (Oral)   Resp 16   Ht 6' (1.829 m)   Wt 186 lb (84.4 kg)   SpO2 94%   BMI 25.23 kg/m  General: Well developed, NAD, BMI noted Neck: No  thyromegaly  HEENT:  Normocephalic . Face symmetric, atraumatic Lungs:  CTA B Normal respiratory effort, no intercostal retractions, no accessory muscle use. Heart: RRR,  no murmur.  Abdomen:  Not distended, soft, non-tender. No rebound or rigidity.   Lower extremities: no pretibial edema bilaterally  Skin: Exposed areas without rash. Not pale. Not jaundice Neurologic:  alert & oriented X3.  Speech normal, gait appropriate for age and unassisted Strength symmetric and appropriate for age.  Psych: Cognition and judgment appear intact.  Cooperative with normal attention span and concentration.  Behavior appropriate. No anxious or depressed appearing.     Assessment    ASSESSMENT HTN dx 09/2018 Anxiety Hyperlipidemia, + FH CAD  Syncope 05-2007, refused w/u, no further sx  Vitamin  D deficiency, DX 09-2018  PLAN Here for CPX -Td 07-2014 - Rec: covid-flu-shingrix shot, RSV: Pros>cons, pt reluctant  -  + FH CAD, stress test (-) 2013.  Has declined to retry ASA due to excessive bleeding before -- CCS: neg  cologuard 11/2018; (+) Cologuard 12-10-21,  Colonoscopy 04/18/2022, + polyps, next 7 years. -- Prostate cancer screening: Check PSA --Labs: BMP AST ALT FLP CBC PSA - lung Ca screening: Does not qualify  --Diet, Exercise: Recommend a healthy diet and exercise 3 hours weekly. -Healthcare POA: Information provided HTN: BP WNL when he donates blood and goes to the dentist.  Continue amlodipine. High cholesterol: On atorvastatin, checking labs Anxiety: Things are going well, he stopped citalopram, plans to restart if needed. Vitamin D deficiency: On supplements, last levels okay. RTC 1 year.

## 2023-01-13 NOTE — Assessment & Plan Note (Signed)
Here for CPX HTN: BP WNL when he donates blood and goes to the dentist.  Continue amlodipine. High cholesterol: On atorvastatin, checking labs Anxiety: Things are going well, he stopped citalopram, plans to restart if needed. Vitamin D deficiency: On supplements, last levels okay. RTC 1 year.

## 2023-01-13 NOTE — Patient Instructions (Addendum)
Vaccines I recommend: RSV vaccine Covid booster Shingrix (shingles) Flu shot  Exercise 3 hours a week   Check the  blood time to time Blood pressure goal:  between 110/65 and  135/85. If it is consistently higher or lower, let me know     GO TO THE LAB : Get the blood work     Next visit with me in 1 year, physical exam.     Please schedule it at the front desk       "Health Care Power of attorney" ,  "Living will" (Advance care planning documents)  If you already have a living will or healthcare power of attorney, is recommended you bring the copy to be scanned in your chart.   The document will be available to all the doctors you see in the system.  Advance care planning is a process that supports adults in  understanding and sharing their preferences regarding future medical care.  The patient's preferences are recorded in documents called Advance Directives and the can be modified at any time while the patient is in full mental capacity.   If you don't have one, please consider create one.      More information at: [email or web address]

## 2023-01-13 NOTE — Assessment & Plan Note (Signed)
Here for CPX -Td 07-2014 - Rec: covid-flu-shingrix shot, RSV: Pros>cons, pt reluctant  -  + FH CAD, stress test (-) 2013.  Has declined to retry ASA due to excessive bleeding before -- CCS: neg  cologuard 11/2018; (+) Cologuard 12-10-21,  Colonoscopy 04/18/2022, + polyps, next 7 years. -- Prostate cancer screening: Check PSA --Labs: BMP AST ALT FLP CBC PSA - lung Ca screening: Does not qualify  --Diet, Exercise: Recommend a healthy diet and exercise 3 hours weekly. -Healthcare POA: Information provided

## 2023-01-14 MED ORDER — ATORVASTATIN CALCIUM 20 MG PO TABS: 20.0000 mg | ORAL_TABLET | Freq: Every day | ORAL | 0 refills | Status: DC

## 2023-01-14 NOTE — Addendum Note (Signed)
Addended by: Conrad [place] D on: 01/14/2023 04:00 PM   Modules accepted: Orders

## 2023-03-12 ENCOUNTER — Other Ambulatory Visit: Payer: Self-pay

## 2023-03-12 MED ORDER — ATORVASTATIN CALCIUM 20 MG PO TABS: 20.0000 mg | ORAL_TABLET | Freq: Every day | ORAL | 3 refills | Status: DC

## 2023-03-12 MED ORDER — AMLODIPINE BESYLATE 5 MG PO TABS: 5.0000 mg | ORAL_TABLET | Freq: Every day | ORAL | 3 refills | Status: DC

## 2023-03-19 ENCOUNTER — Encounter: Payer: Self-pay | Admitting: Internal Medicine

## 2023-05-06 ENCOUNTER — Ambulatory Visit: Payer: Self-pay | Admitting: Internal Medicine

## 2023-05-06 ENCOUNTER — Encounter: Payer: Self-pay | Admitting: Internal Medicine

## 2023-05-06 NOTE — Progress Notes (Signed)
 Established patient visit   Patient: Matthew Bautista   DOB: [birth year]   63 y.o. Male  MRN: [id number] Visit Date: 05/07/2023  Today's healthcare provider: Manuelita Flatness, PA-C   Cc. Elevated blood pressure  Subjective    Pt, with a pmh of HTN managed with amlodipine  5 mg, presents with elevated blood pressure. He reports he went to donate blood and he was turned away due to high blood pressure, but he does not know what the numbers were. He denies checking BP at home.  Pt reports the only change since his last visit in October was that he d/c Celexa . Reports increased anxiety, but also has a stressful job.   Denies diet/lifestyle changes, no new supplements. Denies vision changes, headache, chest pain. Medications: Outpatient Medications Prior to Visit  Medication Sig   amLODipine  (NORVASC ) 5 MG tablet Take 1 tablet (5 mg total) by mouth daily.   Ascorbic Acid (SUPER C COMPLEX PO) Take by mouth.   atorvastatin  (LIPITOR) 20 MG tablet Take 1 tablet (20 mg total) by mouth at bedtime.   B Complex Vitamins (B COMPLEX PO) Take by mouth.   cholecalciferol (VITAMIN D3) 25 MCG (1000 UNIT) tablet Take 2,000 Units by mouth daily.   Coenzyme Q10 (COQ10) 100 MG CAPS 100 mg daily.   [DISCONTINUED] citalopram  (CELEXA ) 20 MG tablet Take 1 tablet (20 mg total) by mouth daily. (Patient not taking: Reported on 01/13/2023)   No facility-administered medications prior to visit.    Review of Systems  Constitutional:  Negative for fatigue and fever.  Respiratory:  Negative for cough and shortness of breath.   Cardiovascular:  Negative for chest pain, palpitations and leg swelling.  Neurological:  Negative for dizziness and headaches.       Objective    BP (!) 158/102   Pulse (!) 108   Temp 98.6 F (37 C) (Oral)   Ht 6' (1.829 m)   Wt 179 lb 4 oz (81.3 kg)   SpO2 97%   BMI 24.31 kg/m    Physical Exam Constitutional:      General: He is awake.     Appearance: He is well-developed.   HENT:     Head: Normocephalic.  Eyes:     Conjunctiva/sclera: Conjunctivae normal.  Cardiovascular:     Rate and Rhythm: Normal rate and regular rhythm.     Heart sounds: Normal heart sounds.  Pulmonary:     Effort: Pulmonary effort is normal.  Skin:    General: Skin is warm.  Neurological:     Mental Status: He is alert and oriented to person, place, and time.  Psychiatric:        Attention and Perception: Attention normal.        Mood and Affect: Mood normal.        Speech: Speech normal.        Behavior: Behavior is cooperative.     No results found for any visits on 05/07/23.  Assessment & Plan    Primary hypertension Assessment & Plan: Chronic, now uncontrolled, managed with amlodipine  5 mg . Pt has d/c celexa  increase could be from anxiety alone. Advised pt to check BP at home, at rest, for the next week and report back.  If values normal at home, could consider re-starting celexa -- pt is open to this as an option to help anxiety related blood pressure.  If values elevated, would increase amlodipine  dose.   Will check tsh/cmp d/t short term nature of BP  increase  Orders: -     TSH -     Comprehensive metabolic panel     Return if symptoms worsen or fail to improve.       Manuelita Flatness, PA-C  [facility] at [facility] [phone] (phone) [phone] (fax)  [facility]

## 2023-05-06 NOTE — Telephone Encounter (Signed)
  Chief Complaint: HTN Symptoms: tension H.A Frequency: this AM Pertinent Negatives: Patient denies chest pain, SOB, fevers, neurologic sx Disposition: [] ED /[] Urgent Care (no appt availability in office) / [x] Appointment(In office/virtual)/ []  [facility]/ [] Home Care/ [] Refused Recommended Disposition /[]  Mobile Bus/ []  Follow-up with PCP Additional Notes: Patient c/o HTN. Reports that he attempted to donate blood with [facility] last week but they denied him d/t his HTN. Reports that was one of the worst days of his life d/t work stress. Today, he decided to F/U on BP results and noticed that it was elevated stated below. He currently denies any SOB, chest pain, neuro sx. Does endorse a tension H/A. Scheduled patient per protocol on 05/07/2023. Patient verbalized understanding and to call back/911 with worsening symptoms.    Copied from CRM [id number]. Topic: Clinical - Red Word Triage >> May 06, 2023  2:53 PM Rosina BIRCH wrote: Red Word that prompted transfer to Nurse Triage: blood pressure 159/107 Reason for Disposition  Systolic BP  >= 180 OR Diastolic >= 110  Answer Assessment - Initial Assessment Questions 1. BLOOD PRESSURE: What is the blood pressure? Did you take at least two measurements 5 minutes apart?     159/107 - [facility] which denied him giving blood 160's/107-108 he estimates this morning Cannot retake BP at this time, does not have machine currently. Of note, did instruct him to re-take his BP ASAP.  2. ONSET: When did you take your blood pressure?     This AM 3. HOW: How did you take your blood pressure? (e.g., automatic home BP monitor, visiting nurse)     Auto BP -- does not have it on hand 4. HISTORY: Do you have a history of high blood pressure?     Yes, but never had HTN on meds 5. MEDICINES: Are you taking any medicines for blood pressure? Have you missed any doses recently?     Norvasc , lipitor Reports he also stopped  taking Celexa  around that time. Denies missing a dose 6. OTHER SYMPTOMS: Do you have any symptoms? (e.g., blurred vision, chest pain, difficulty breathing, headache, weakness)     Worst day of his life when he had the High BP at [facility] - initially thought r/t stress, but he decided to look at results from then and thought he would give us  a call - reports high stress at work Tension headache Denies CP, SOB, blurry vision (wears glasses)  Protocols used: Blood Pressure - High-A-AH

## 2023-05-06 NOTE — Telephone Encounter (Signed)
 Pt scheduled 05/07/23 with [facility].

## 2023-05-07 ENCOUNTER — Other Ambulatory Visit: Payer: Self-pay | Admitting: Physician Assistant

## 2023-05-07 ENCOUNTER — Encounter: Payer: Self-pay | Admitting: Physician Assistant

## 2023-05-07 ENCOUNTER — Ambulatory Visit: Payer: BC Managed Care – PPO | Admitting: Physician Assistant

## 2023-05-07 VITALS — BP 158/102 | HR 108 | Temp 98.6°F | Ht 72.0 in | Wt 179.2 lb

## 2023-05-07 DIAGNOSIS — I10 Essential (primary) hypertension: Secondary | ICD-10-CM | POA: Diagnosis not present

## 2023-05-07 DIAGNOSIS — F419 Anxiety disorder, unspecified: Secondary | ICD-10-CM

## 2023-05-07 LAB — COMPREHENSIVE METABOLIC PANEL
ALT: 14 U/L (ref 0–53)
AST: 16 U/L (ref 0–37)
Albumin: 4.9 g/dL (ref 3.5–5.2)
Alkaline Phosphatase: 62 U/L (ref 39–117)
BUN: 11 mg/dL (ref 6–23)
CO2: 28 meq/L (ref 19–32)
Calcium: 9.4 mg/dL (ref 8.4–10.5)
Chloride: 103 meq/L (ref 96–112)
Creatinine, Ser: 0.88 mg/dL (ref 0.40–1.50)
GFR: 93.36 mL/min (ref >60.00)
Glucose, Bld: 103 mg/dL — ABNORMAL HIGH (ref 70–99)
Potassium: 3.9 meq/L (ref 3.5–5.1)
Sodium: 142 meq/L (ref 135–145)
Total Bilirubin: 0.7 mg/dL (ref 0.2–1.2)
Total Protein: 7.6 g/dL (ref 6.0–8.3)

## 2023-05-07 LAB — TSH: TSH: 1.99 u[IU]/mL (ref 0.35–5.50)

## 2023-05-07 MED ORDER — CITALOPRAM HYDROBROMIDE 20 MG PO TABS: 20.0000 mg | ORAL_TABLET | Freq: Every day | ORAL | 2 refills | Status: DC

## 2023-05-07 NOTE — Telephone Encounter (Signed)
Appt scheduled today w/ Lillia Abed

## 2023-05-07 NOTE — Assessment & Plan Note (Signed)
 Chronic, now uncontrolled, managed with amlodipine  5 mg . Pt has d/c celexa  increase could be from anxiety alone. Advised pt to check BP at home, at rest, for the next week and report back.  If values normal at home, could consider re-starting celexa -- pt is open to this as an option to help anxiety related blood pressure.  If values elevated, would increase amlodipine  dose.   Will check tsh/cmp d/t short term nature of BP increase

## 2023-05-07 NOTE — Telephone Encounter (Signed)
Sent in celexa.

## 2023-05-08 ENCOUNTER — Ambulatory Visit: Payer: Self-pay | Admitting: Internal Medicine

## 2023-05-08 ENCOUNTER — Encounter: Payer: Self-pay | Admitting: Physician Assistant

## 2023-05-08 NOTE — Telephone Encounter (Signed)
  Chief Complaint: requesting anxiety medication Symptoms: anxiety, hypertension Frequency: worsened today since office visit yesterday Pertinent Negatives: Patient denies chest pain, SOB, numbness, headaches. Disposition: [] ED /[] Urgent Care (no appt availability in office) / [] Appointment(In office/virtual)/ []  [facility]/ [] Home Care/ [] Refused Recommended Disposition /[] [facility]/ [x]  Follow-up with PCP Additional Notes: Wife and patient on phone for triage. Wife states she is not sure if their BP cuff is accurate states BP was around 160/105 this morning for patient. Patient seen yesterday for office visit with PA Manuelita Flatness, and was instructed to rest and monitor BP readings for a week and medications would be managed based on that. Wife states she was not aware and called in concerned about BP and pt's anxiety. Requesting anxiety medication for patient and states they use [facility] now and not [facility] for their pharmacy. Called [facility] and staff are aware.   Copied from CRM [id number]. Topic: Clinical - Red Word Triage >> May 08, 2023 10:05 AM Montie POUR wrote: Red Word that prompted transfer to Nurse Triage: He is having high anxiety. He had an appointment yesterday due to elevated blood pressure. He has not heard anything back from doctor's office and he is having high anxiety of the unknown. His blood pressure is up and down. Reason for Disposition  [1] Caller has NON-URGENT medicine question about med that PCP prescribed AND [2] triager unable to answer question  Answer Assessment - Initial Assessment Questions 1. NAME of MEDICINE: What medicine(s) are you calling about?     Citalopram .  2. QUESTION: What is your question? (e.g., double dose of medicine, side effect)     Asking if he can go back on the medication, been off since October.   3. PRESCRIBER: Who prescribed the medicine? Reason: if prescribed by specialist, call should be referred to that group.      Dr Amon.  4. SYMPTOMS: Do you have any symptoms? If Yes, ask: What symptoms are you having?  How bad are the symptoms (e.g., mild, moderate, severe)     Anxiety, high blood pressure (160/105 with home BP cuff and wife is not sure if their cuff is accurate) . Wife is asking if patient can go back on citalopram  and get something temporarily for anxiety. Wife states she is calling in due to patient left work early today for anxiety. She feels like waiting a week per provider's recommendation is not going to work due to patient's anxiety level.  Protocols used: Medication Question Call-A-AH

## 2023-05-08 NOTE — Telephone Encounter (Signed)
 See mychart messages. PCP aware of message- will answer over weekend.

## 2023-05-08 NOTE — Telephone Encounter (Signed)
 Pt called today, stating that someone asked him to give us  a call back after 2pm. Pt was triaged this morning by a different RN for concerns of elevated BP and request for anxiety medication, citalopram . It appears that that triage call and information was already routed to the office. RN assured pt the issue is being relayed to the appropriate people for follow-up. Pt has no new concerns or symptoms.  Copied from CRM [id number]. Topic: General - Call Back - No Documentation >> May 08, 2023  3:28 PM Pinkey ORN wrote: Patient states he spoke with Kilgore,Cynthia Triage Nurse this morning about a prescription and was advised if he hadn't heard anything by 2PM then to call back. Patient is requesting to speak with Kilgore,Cynthia and that call back number is [phone] Reason for Disposition  Caller with prescription and triager answers question  Answer Assessment - Initial Assessment Questions 1. DRUG NAME: What medicine do you need to have refilled?     Citalopram  3. EXPIRATION DATE: What is the expiration date? (Note: The label states when the prescription will expire, and thus can no longer be refilled.)     05/06/2024 4. PRESCRIBING HCP: Who prescribed it? Reason: If prescribed by specialist, call should be referred to that group.     Manuelita Flatness  Protocols used: Medication Refill and Renewal Call-A-AH

## 2023-05-08 NOTE — Telephone Encounter (Signed)
 See mychart messages- has been sent to [place] since she saw him yesterday, she has not answered yet.

## 2023-05-09 MED ORDER — CLONAZEPAM 0.5 MG PO TABS: 0.2500 mg | ORAL_TABLET | Freq: Two times a day (BID) | ORAL | 0 refills | Status: DC | PRN

## 2023-05-09 NOTE — Addendum Note (Signed)
 Addended by: Devonna Foley E on: 05/09/2023 02:26 PM   Modules accepted: Orders

## 2023-05-09 NOTE — Telephone Encounter (Signed)
 PDMP okay, Rx sent

## 2023-05-26 NOTE — Telephone Encounter (Signed)
You do not

## 2023-06-08 MED ORDER — AMLODIPINE BESYLATE 5 MG PO TABS: 5.0000 mg | ORAL_TABLET | Freq: Every day | ORAL | 1 refills | Status: DC

## 2023-06-08 NOTE — Addendum Note (Signed)
 Addended by: Conrad [place] D on: 06/08/2023 07:48 AM   Modules accepted: Orders

## 2023-06-22 ENCOUNTER — Encounter: Payer: Self-pay | Admitting: Internal Medicine

## 2023-06-22 ENCOUNTER — Ambulatory Visit: Admitting: Internal Medicine

## 2023-06-22 VITALS — BP 122/76 | HR 67 | Temp 97.7°F | Resp 16 | Ht 72.0 in | Wt 177.4 lb

## 2023-06-22 DIAGNOSIS — I10 Essential (primary) hypertension: Secondary | ICD-10-CM

## 2023-06-22 DIAGNOSIS — F419 Anxiety disorder, unspecified: Secondary | ICD-10-CM | POA: Diagnosis not present

## 2023-06-22 DIAGNOSIS — E782 Mixed hyperlipidemia: Secondary | ICD-10-CM

## 2023-06-22 LAB — LIPID PANEL
Cholesterol: 173 mg/dL (ref 0–200)
HDL: 58.2 mg/dL (ref >39.00)
LDL Cholesterol: 90 mg/dL (ref 0–99)
NonHDL: 115.21
Total CHOL/HDL Ratio: 3
Triglycerides: 125 mg/dL (ref 0.0–149.0)
VLDL: 25 mg/dL (ref 0.0–40.0)

## 2023-06-22 NOTE — Assessment & Plan Note (Signed)
 HTN  seen at this office 05/07/2023,BP was elevated, possibly from anxiety.  Medication was not changed, on amlodipine 5 mg, ambulatory BPs since then better in the 130s.  He is not sure if his  cuff is accurate, recommend to continue checking, if consistently out of range, he could ask for a nurse visit and bring his cuff for comparison. Anxiety: Work-related, restarted on citalopram, doing better, did not need clonazepam. High cholesterol: LDL October 2024--- 98, atorvastatin increased to 20 mg, has a healthy lifestyle, has increased his physical activity, has lost approximately 10 pounds.  Recent LFTs normal, check FLP. RTC scheduled for October CPX

## 2023-06-22 NOTE — Patient Instructions (Addendum)
  Check the  blood pressure regularly Blood pressure goal:  between 110/65 and  135/85. If it is consistently higher or lower, let me know     GO TO THE LAB : Get the blood work    See you in October for your physical

## 2023-06-22 NOTE — Progress Notes (Signed)
   Subjective:    Patient ID: Matthew Bautista, male    DOB: [birth year], 63 y.o.   MRN: [id number]  DOS:  06/22/2023 Type of visit - description: Follow-up Recently seen at the office with anxiety and elevated BP. Re started SSRIs, feeling very well.  Wt Readings from Last 3 Encounters:  06/22/23 177 lb 6 oz (80.5 kg)  05/07/23 179 lb 4 oz (81.3 kg)  01/13/23 186 lb (84.4 kg)    Review of Systems See above   Past Medical History:  Diagnosis Date   Anxiety    Chronic kidney disease    kidney stones   Hyperlipidemia    Hypertension    Sebaceous cyst    SYNCOPE 05/30/2007   refused w/u, no further sx     Past Surgical History:  Procedure Laterality Date   COLONOSCOPY  04/18/2022   NO PAST SURGERIES      Current Outpatient Medications  Medication Instructions   amLODipine (NORVASC) 5 mg, Oral, Daily   Ascorbic Acid (SUPER C COMPLEX PO) Take by mouth.   atorvastatin (LIPITOR) 20 mg, Oral, Daily at bedtime   B Complex Vitamins (B COMPLEX PO) Take by mouth.   cholecalciferol (VITAMIN D3) 2,000 Units, Daily   citalopram (CELEXA) 20 mg, Oral, Daily   clonazePAM (KLONOPIN) 0.25-0.5 mg, Oral, 2 times daily PRN   CoQ10 100 mg, Daily       Objective:   Physical Exam BP 122/76   Pulse 67   Temp 97.7 F (36.5 C) (Oral)   Resp 16   Ht 6' (1.829 m)   Wt 177 lb 6 oz (80.5 kg)   SpO2 97%   BMI 24.06 kg/m  General:   Well developed, NAD, BMI noted. HEENT:  Normocephalic . Face symmetric, atraumatic Lower extremities: no pretibial edema bilaterally  Skin: Not pale. Not jaundice Neurologic:  alert & oriented X3.  Speech normal, gait appropriate for age and unassisted Psych--  Cognition and judgment appear intact.  Cooperative with normal attention span and concentration.  Behavior appropriate. No anxious or depressed appearing.      Assessment     ASSESSMENT HTN dx 09/2018 Anxiety Hyperlipidemia, + FH CAD  Syncope 05-2007, refused w/u, no further sx  Vitamin D  deficiency, DX 09-2018  PLAN HTN  seen at this office 05/07/2023,BP was elevated, possibly from anxiety.  Medication was not changed, on amlodipine 5 mg, ambulatory BPs since then better in the 130s.  He is not sure if his  cuff is accurate, recommend to continue checking, if consistently out of range, he could ask for a nurse visit and bring his cuff for comparison. Anxiety: Work-related, restarted on citalopram, doing better, did not need clonazepam. High cholesterol: LDL October 2024--- 98, atorvastatin increased to 20 mg, has a healthy lifestyle, has increased his physical activity, has lost approximately 10 pounds.  Recent LFTs normal, check FLP. RTC scheduled for October CPX

## 2023-06-24 ENCOUNTER — Encounter: Payer: Self-pay | Admitting: Internal Medicine

## 2023-07-05 ENCOUNTER — Encounter: Payer: Self-pay | Admitting: Internal Medicine

## 2023-07-06 MED ORDER — ATORVASTATIN CALCIUM 20 MG PO TABS: 20.0000 mg | ORAL_TABLET | Freq: Every day | ORAL | 1 refills | Status: DC

## 2023-08-04 ENCOUNTER — Other Ambulatory Visit: Payer: Self-pay | Admitting: Internal Medicine

## 2023-08-04 DIAGNOSIS — F419 Anxiety disorder, unspecified: Secondary | ICD-10-CM

## 2023-09-09 ENCOUNTER — Other Ambulatory Visit: Payer: Self-pay | Admitting: Internal Medicine

## 2023-10-09 ENCOUNTER — Other Ambulatory Visit: Payer: Self-pay | Admitting: Internal Medicine

## 2023-12-28 ENCOUNTER — Encounter: Payer: Self-pay | Admitting: Internal Medicine

## 2023-12-29 MED ORDER — AMLODIPINE BESYLATE 5 MG PO TABS: 5.0000 mg | ORAL_TABLET | Freq: Every day | ORAL | 1 refills | Status: DC

## 2024-01-15 ENCOUNTER — Ambulatory Visit (INDEPENDENT_AMBULATORY_CARE_PROVIDER_SITE_OTHER): Payer: BC Managed Care – PPO | Admitting: Internal Medicine

## 2024-01-15 ENCOUNTER — Encounter: Payer: Self-pay | Admitting: Internal Medicine

## 2024-01-15 VITALS — BP 130/82 | HR 72 | Temp 97.8°F | Resp 16 | Ht 72.0 in | Wt 175.5 lb

## 2024-01-15 DIAGNOSIS — I10 Essential (primary) hypertension: Secondary | ICD-10-CM | POA: Diagnosis not present

## 2024-01-15 DIAGNOSIS — F419 Anxiety disorder, unspecified: Secondary | ICD-10-CM

## 2024-01-15 DIAGNOSIS — E782 Mixed hyperlipidemia: Secondary | ICD-10-CM

## 2024-01-15 DIAGNOSIS — E559 Vitamin D deficiency, unspecified: Secondary | ICD-10-CM | POA: Diagnosis not present

## 2024-01-15 DIAGNOSIS — Z0000 Encounter for general adult medical examination without abnormal findings: Secondary | ICD-10-CM | POA: Diagnosis not present

## 2024-01-15 LAB — COMPREHENSIVE METABOLIC PANEL WITH GFR
ALT: 12 U/L (ref 0–53)
AST: 14 U/L (ref 0–37)
Albumin: 4.5 g/dL (ref 3.5–5.2)
Alkaline Phosphatase: 62 U/L (ref 39–117)
BUN: 9 mg/dL (ref 6–23)
CO2: 30 meq/L (ref 19–32)
Calcium: 9 mg/dL (ref 8.4–10.5)
Chloride: 104 meq/L (ref 96–112)
Creatinine, Ser: 0.74 mg/dL (ref 0.40–1.50)
GFR: 97.9 mL/min (ref >60.00)
Glucose, Bld: 82 mg/dL (ref 70–99)
Potassium: 4.5 meq/L (ref 3.5–5.1)
Sodium: 141 meq/L (ref 135–145)
Total Bilirubin: 0.5 mg/dL (ref 0.2–1.2)
Total Protein: 6.9 g/dL (ref 6.0–8.3)

## 2024-01-15 LAB — CBC WITH DIFFERENTIAL/PLATELET
Basophils Absolute: 0.1 K/uL (ref 0.0–0.1)
Basophils Relative: 1.3 % (ref 0.0–3.0)
Eosinophils Absolute: 0.2 K/uL (ref 0.0–0.7)
Eosinophils Relative: 6.2 % — ABNORMAL HIGH (ref 0.0–5.0)
HCT: 40.4 % (ref 39.0–52.0)
Hemoglobin: 13.9 g/dL (ref 13.0–17.0)
Lymphocytes Relative: 38.2 % (ref 12.0–46.0)
Lymphs Abs: 1.5 K/uL (ref 0.7–4.0)
MCHC: 34.4 g/dL (ref 30.0–36.0)
MCV: 95 fl (ref 78.0–100.0)
Monocytes Absolute: 0.5 K/uL (ref 0.1–1.0)
Monocytes Relative: 12 % (ref 3.0–12.0)
Neutro Abs: 1.7 K/uL (ref 1.4–7.7)
Neutrophils Relative %: 42.3 % — ABNORMAL LOW (ref 43.0–77.0)
Platelets: 249 K/uL (ref 150.0–400.0)
RBC: 4.25 Mil/uL (ref 4.22–5.81)
RDW: 13.3 % (ref 11.5–15.5)
WBC: 4 K/uL (ref 4.0–10.5)

## 2024-01-15 LAB — LIPID PANEL
Cholesterol: 191 mg/dL (ref 0–200)
HDL: 64 mg/dL (ref >39.00)
LDL Cholesterol: 95 mg/dL (ref 0–99)
NonHDL: 127.47
Total CHOL/HDL Ratio: 3
Triglycerides: 162 mg/dL — ABNORMAL HIGH (ref 0.0–149.0)
VLDL: 32.4 mg/dL (ref 0.0–40.0)

## 2024-01-15 LAB — VITAMIN D 25 HYDROXY (VIT D DEFICIENCY, FRACTURES): VITD: 23.5 ng/mL — ABNORMAL LOW (ref 30.00–100.00)

## 2024-01-15 LAB — PSA: PSA: 1.28 ng/mL (ref 0.10–4.00)

## 2024-01-15 MED ORDER — AMLODIPINE BESYLATE 5 MG PO TABS: 5.0000 mg | ORAL_TABLET | Freq: Every day | ORAL | 1 refills | Status: DC

## 2024-01-15 MED ORDER — ATORVASTATIN CALCIUM 20 MG PO TABS: 20.0000 mg | ORAL_TABLET | Freq: Every day | ORAL | 1 refills | Status: DC

## 2024-01-15 NOTE — Progress Notes (Signed)
 Subjective:    Patient ID: Matthew Bautista, male    DOB: [birth year], 63 y.o.   MRN: [id number]  DOS:  01/15/2024 CPX  Discussed the use of AI scribe software for clinical note transcription with the patient, who gave verbal consent to proceed.  History of Present Illness  General health status - Feels well overall - Near target weight - Attributes weight loss to exercise and portion control  Cardiovascular symptoms - No recent chest pain - Blood pressure improved with exercise  Respiratory symptoms - No respiratory issues  Gastrointestinal symptoms - No gastrointestinal problems  Genitourinary symptoms - No urinary symptoms  Psychiatric symptoms - Takes citalopram  for anxiety  Vitamin d  deficiency - Takes vitamin D  supplements for past deficiency    Review of Systems  A 14 point review of systems is negative    Past Medical History:  Diagnosis Date   Anxiety    Chronic kidney disease    kidney stones   Hyperlipidemia    Hypertension    Sebaceous cyst    SYNCOPE 05/30/2007   refused w/u, no further sx     Past Surgical History:  Procedure Laterality Date   COLONOSCOPY  04/18/2022   NO PAST SURGERIES      Current Outpatient Medications  Medication Instructions   amLODipine  (NORVASC ) 5 mg, Oral, Daily   Ascorbic Acid (SUPER C COMPLEX PO) Take by mouth.   atorvastatin  (LIPITOR) 20 mg, Oral, Daily at bedtime   B Complex Vitamins (B COMPLEX PO) Take by mouth.   cholecalciferol (VITAMIN D3) 2,000 Units, Daily   citalopram  (CELEXA ) 20 mg, Oral, Daily   CoQ10 100 mg, Daily       Objective:   Physical Exam BP 130/82   Pulse 72   Temp 97.8 F (36.6 C) (Oral)   Resp 16   Ht 6' (1.829 m)   Wt 175 lb 8 oz (79.6 kg)   SpO2 97%   BMI 23.80 kg/m  General: Well developed, NAD, BMI noted Neck: No  thyromegaly  HEENT:  Normocephalic . Face symmetric, atraumatic Lungs:  CTA B Normal respiratory effort, no intercostal retractions, no accessory  muscle use. Heart: RRR,  no murmur.  Abdomen:  Not distended, soft, non-tender. No rebound or rigidity.   Lower extremities: no pretibial edema bilaterally  Skin: Exposed areas without rash. Not pale. Not jaundice Neurologic:  alert & oriented X3.  Speech normal, gait appropriate for age and unassisted Strength symmetric and appropriate for age.  Psych: Cognition and judgment appear intact.  Cooperative with normal attention span and concentration.  Behavior appropriate. No anxious or depressed appearing.     Assessment    ASSESSMENT HTN dx 09/2018 Anxiety Hyperlipidemia, + FH CAD  Syncope 05-2007, refused w/u, no further sx  Vitamin D  deficiency, DX 09-2018  Assessment & Plan Here for CPX -Td 07-2014 - Rec: flu shot-covid-flu-shingrix shot-- pros>cons, pt reluctant  -  + FH CAD, stress test (-) 2013.  Has declined to retry ASA due to excessive bleeding before -- CCS: neg  cologuard 11/2018; (+) Cologuard 12-10-21,  Colonoscopy 04/18/2022, + polyps, next 7 years. -- Prostate cancer screening: Check PSA --Labs: CBC CMP FLP PSA vitamin D  - lung Ca screening: Does not qualify  --Diet, Exercise: Doing better, + weight loss, increase his exercise, better portion control.  Other issues addressed Anxiety Anxiety managed with citalopram  20 mg daily. Hypertension Hypertension managed with amlodipine  5 mg daily. Blood pressure stable with exercise and weight loss. Hyperlipidemia Hyperlipidemia  managed with atorvastatin  20 mg daily.  Recheck FLP Vitamin D  deficiency  managed with cholecalciferol 2,000 Units daily.  Labs. RTC 1 year.  CPX.  Sooner if needed

## 2024-01-15 NOTE — Patient Instructions (Signed)
 GO TO THE LAB :  Get the blood work    Then, go to the front desk for the checkout Please make an appointment      Check the  blood pressure regularly Blood pressure goal:  between 110/65 and  135/85. If it is consistently higher or lower, let me know  Consider the following vaccines: Flu shot, COVID booster, shingles  Please read more detailed instructions below     VISIT SUMMARY: You had your annual physical exam today. Overall, you are feeling well and have managed to maintain a near target weight through exercise and portion control. We discussed your current medications and health status, and planned for some follow-up blood work.     ANXIETY: Anxiety managed with medication. -Continue taking citalopram  20 mg daily.  HYPERTENSION: High blood pressure managed with medication and lifestyle changes. -Continue taking amlodipine  5 mg daily. -Your blood pressure is stable with exercise and weight loss.  HYPERLIPIDEMIA: High cholesterol managed with medication. -Continue taking atorvastatin  20 mg daily. -We will recheck your cholesterol levels    VITAMIN D  DEFICIENCY: Low vitamin D  levels managed with supplements. -Continue taking cholecalciferol 2,000 Units daily. -We will recheck your vitamin D  levels

## 2024-01-15 NOTE — Assessment & Plan Note (Signed)
 Here for CPX   Other issues addressed Anxiety Anxiety managed with citalopram  20 mg daily. Hypertension Hypertension managed with amlodipine  5 mg daily. Blood pressure stable with exercise and weight loss. Hyperlipidemia Hyperlipidemia managed with atorvastatin  20 mg daily.  Recheck FLP Vitamin D  deficiency  managed with cholecalciferol 2,000 Units daily.  Labs. RTC 1 year.  CPX.  Sooner if needed

## 2024-01-15 NOTE — Assessment & Plan Note (Signed)
 Here for CPX -Td 07-2014 - Rec: flu shot-covid-flu-shingrix shot-- pros>cons, pt reluctant  -  + FH CAD, stress test (-) 2013.  Has declined to retry ASA due to excessive bleeding before -- CCS: neg  cologuard 11/2018; (+) Cologuard 12-10-21,  Colonoscopy 04/18/2022, + polyps, next 7 years. -- Prostate cancer screening: Check PSA --Labs: CBC CMP FLP PSA vitamin D  - lung Ca screening: Does not qualify  --Diet, Exercise: Doing better, + weight loss, increase his exercise, better portion control.

## 2024-01-17 ENCOUNTER — Ambulatory Visit: Payer: Self-pay | Admitting: Internal Medicine

## 2024-01-18 MED ORDER — VITAMIN D (ERGOCALCIFEROL) 1.25 MG (50000 UNIT) PO CAPS: 50000.0000 [IU] | ORAL_CAPSULE | ORAL | 0 refills | Status: AC

## 2024-03-23 ENCOUNTER — Encounter: Payer: Self-pay | Admitting: Internal Medicine

## 2024-03-23 MED ORDER — AMLODIPINE BESYLATE 5 MG PO TABS: 5.0000 mg | ORAL_TABLET | Freq: Every day | ORAL | 1 refills | Status: AC

## 2024-04-09 ENCOUNTER — Other Ambulatory Visit: Payer: Self-pay | Admitting: Internal Medicine

## 2024-04-12 MED ORDER — ATORVASTATIN CALCIUM 20 MG PO TABS: 20.0000 mg | ORAL_TABLET | Freq: Every day | ORAL | 1 refills | Status: AC

## 2024-04-12 NOTE — Addendum Note (Signed)
 Addended by: Taige Housman D on: 04/12/2024 07:39 AM   Modules accepted: Orders

## 2025-01-16 ENCOUNTER — Encounter: Admitting: Internal Medicine
